# Patient Record
Sex: Female | Born: 1935 | Race: White | Hispanic: No | Smoking: Never smoker
Health system: Southern US, Community
[De-identification: ages and names within clinical notes are randomized; demographics above are authoritative.]

## PROBLEM LIST (undated history)

## (undated) DIAGNOSIS — M25519 Pain in unspecified shoulder: Secondary | ICD-10-CM

## (undated) DIAGNOSIS — I509 Heart failure, unspecified: Secondary | ICD-10-CM

## (undated) DIAGNOSIS — M810 Age-related osteoporosis without current pathological fracture: Secondary | ICD-10-CM

## (undated) DIAGNOSIS — I428 Other cardiomyopathies: Secondary | ICD-10-CM

## (undated) DIAGNOSIS — Z95810 Presence of automatic (implantable) cardiac defibrillator: Secondary | ICD-10-CM

## (undated) DIAGNOSIS — I4891 Unspecified atrial fibrillation: Secondary | ICD-10-CM

## (undated) DIAGNOSIS — R251 Tremor, unspecified: Secondary | ICD-10-CM

## (undated) DIAGNOSIS — N390 Urinary tract infection, site not specified: Secondary | ICD-10-CM

## (undated) DIAGNOSIS — I10 Essential (primary) hypertension: Secondary | ICD-10-CM

## (undated) DIAGNOSIS — I472 Ventricular tachycardia: Secondary | ICD-10-CM

## (undated) DIAGNOSIS — Z950 Presence of cardiac pacemaker: Secondary | ICD-10-CM

## (undated) DIAGNOSIS — M25552 Pain in left hip: Secondary | ICD-10-CM

## (undated) DIAGNOSIS — M1990 Unspecified osteoarthritis, unspecified site: Secondary | ICD-10-CM

## (undated) DIAGNOSIS — E7800 Pure hypercholesterolemia, unspecified: Secondary | ICD-10-CM

## (undated) DIAGNOSIS — R42 Dizziness and giddiness: Secondary | ICD-10-CM

## (undated) DIAGNOSIS — F419 Anxiety disorder, unspecified: Secondary | ICD-10-CM

## (undated) DIAGNOSIS — I519 Heart disease, unspecified: Secondary | ICD-10-CM

## (undated) DIAGNOSIS — E785 Hyperlipidemia, unspecified: Secondary | ICD-10-CM

## (undated) HISTORY — DX: Essential (primary) hypertension: I10

## (undated) HISTORY — DX: Heart failure, unspecified: I50.9

## (undated) HISTORY — PX: TONSILLECTOMY: SUR1361

## (undated) HISTORY — DX: Anxiety disorder, unspecified: F41.9

## (undated) HISTORY — DX: Presence of cardiac pacemaker: Z95.0

## (undated) HISTORY — DX: Age-related osteoporosis without current pathological fracture: M81.0

## (undated) HISTORY — DX: Heart disease, unspecified: I51.9

## (undated) HISTORY — DX: Presence of automatic (implantable) cardiac defibrillator: Z95.810

## (undated) HISTORY — DX: Pure hypercholesterolemia, unspecified: E78.00

## (undated) HISTORY — DX: Hyperlipidemia, unspecified: E78.5

## (undated) HISTORY — DX: Unspecified atrial fibrillation: I48.91

## (undated) HISTORY — DX: Pain in unspecified shoulder: M25.519

---

## 1971-02-14 HISTORY — PX: ABDOMINAL HYSTERECTOMY: SHX81

## 1990-02-13 HISTORY — PX: ANKLE FRACTURE SURGERY: SHX122

## 1990-02-13 HISTORY — PX: ANKLE SURGERY: SHX546

## 1992-02-14 HISTORY — PX: BLADDER SUSPENSION: SHX72

## 2000-12-12 ENCOUNTER — Ambulatory Visit (HOSPITAL_COMMUNITY): Admission: RE | Admit: 2000-12-12 | Discharge: 2000-12-12 | Payer: Self-pay | Admitting: Family Medicine

## 2000-12-12 ENCOUNTER — Encounter: Payer: Self-pay | Admitting: Family Medicine

## 2001-02-13 HISTORY — PX: OTHER SURGICAL HISTORY: SHX169

## 2001-07-26 ENCOUNTER — Ambulatory Visit (HOSPITAL_COMMUNITY): Admission: RE | Admit: 2001-07-26 | Discharge: 2001-07-26 | Payer: Self-pay | Admitting: Family Medicine

## 2001-07-26 ENCOUNTER — Encounter: Payer: Self-pay | Admitting: Family Medicine

## 2001-08-29 ENCOUNTER — Encounter: Payer: Self-pay | Admitting: Emergency Medicine

## 2001-08-29 ENCOUNTER — Inpatient Hospital Stay (HOSPITAL_COMMUNITY): Admission: EM | Admit: 2001-08-29 | Discharge: 2001-08-31 | Payer: Self-pay | Admitting: Emergency Medicine

## 2001-09-02 ENCOUNTER — Encounter: Payer: Self-pay | Admitting: Cardiology

## 2001-09-02 ENCOUNTER — Inpatient Hospital Stay (HOSPITAL_COMMUNITY): Admission: EM | Admit: 2001-09-02 | Discharge: 2001-09-09 | Payer: Self-pay | Admitting: Emergency Medicine

## 2001-09-07 ENCOUNTER — Encounter: Payer: Self-pay | Admitting: Cardiology

## 2001-09-23 ENCOUNTER — Encounter: Payer: Self-pay | Admitting: Cardiology

## 2001-09-23 ENCOUNTER — Ambulatory Visit (HOSPITAL_COMMUNITY): Admission: RE | Admit: 2001-09-23 | Discharge: 2001-09-23 | Payer: Self-pay | Admitting: Cardiology

## 2003-03-15 ENCOUNTER — Emergency Department (HOSPITAL_COMMUNITY): Admission: EM | Admit: 2003-03-15 | Discharge: 2003-03-15 | Payer: Self-pay | Admitting: Emergency Medicine

## 2003-06-29 ENCOUNTER — Emergency Department (HOSPITAL_COMMUNITY): Admission: EM | Admit: 2003-06-29 | Discharge: 2003-06-29 | Payer: Self-pay | Admitting: Emergency Medicine

## 2004-01-05 ENCOUNTER — Ambulatory Visit: Payer: Self-pay | Admitting: Internal Medicine

## 2004-02-11 ENCOUNTER — Ambulatory Visit: Payer: Self-pay | Admitting: Internal Medicine

## 2004-02-11 ENCOUNTER — Observation Stay (HOSPITAL_COMMUNITY): Admission: RE | Admit: 2004-02-11 | Discharge: 2004-02-11 | Payer: Self-pay | Admitting: Internal Medicine

## 2004-02-25 ENCOUNTER — Ambulatory Visit: Payer: Self-pay

## 2004-02-25 ENCOUNTER — Ambulatory Visit: Payer: Self-pay | Admitting: Internal Medicine

## 2004-06-07 ENCOUNTER — Ambulatory Visit: Payer: Self-pay | Admitting: Internal Medicine

## 2004-09-29 ENCOUNTER — Ambulatory Visit: Payer: Self-pay | Admitting: *Deleted

## 2004-09-29 ENCOUNTER — Ambulatory Visit: Payer: Self-pay | Admitting: Internal Medicine

## 2005-03-01 ENCOUNTER — Ambulatory Visit: Payer: Self-pay | Admitting: Internal Medicine

## 2005-06-22 ENCOUNTER — Ambulatory Visit: Payer: Self-pay | Admitting: *Deleted

## 2005-09-28 ENCOUNTER — Ambulatory Visit: Payer: Self-pay | Admitting: Internal Medicine

## 2005-12-28 ENCOUNTER — Ambulatory Visit: Payer: Self-pay | Admitting: Internal Medicine

## 2006-03-29 ENCOUNTER — Ambulatory Visit: Payer: Self-pay | Admitting: Internal Medicine

## 2006-06-20 ENCOUNTER — Ambulatory Visit: Payer: Self-pay | Admitting: Internal Medicine

## 2006-06-28 ENCOUNTER — Ambulatory Visit: Payer: Self-pay | Admitting: Internal Medicine

## 2006-08-03 ENCOUNTER — Ambulatory Visit: Payer: Self-pay | Admitting: Internal Medicine

## 2006-09-27 ENCOUNTER — Ambulatory Visit: Payer: Self-pay | Admitting: Internal Medicine

## 2007-03-28 ENCOUNTER — Ambulatory Visit: Payer: Self-pay | Admitting: Internal Medicine

## 2007-07-03 ENCOUNTER — Ambulatory Visit: Payer: Self-pay | Admitting: Internal Medicine

## 2007-10-03 ENCOUNTER — Ambulatory Visit: Payer: Self-pay | Admitting: Internal Medicine

## 2007-10-16 ENCOUNTER — Encounter: Payer: Self-pay | Admitting: Infectious Diseases

## 2007-10-16 ENCOUNTER — Ambulatory Visit: Payer: Self-pay | Admitting: Orthopedic Surgery

## 2007-10-16 DIAGNOSIS — M7150 Other bursitis, not elsewhere classified, unspecified site: Secondary | ICD-10-CM

## 2007-12-24 ENCOUNTER — Ambulatory Visit (HOSPITAL_COMMUNITY): Admission: RE | Admit: 2007-12-24 | Discharge: 2007-12-24 | Payer: Self-pay | Admitting: Family Medicine

## 2008-01-02 ENCOUNTER — Ambulatory Visit: Payer: Self-pay | Admitting: Internal Medicine

## 2008-01-24 ENCOUNTER — Encounter: Payer: Self-pay | Admitting: Cardiology

## 2008-01-24 ENCOUNTER — Ambulatory Visit: Payer: Self-pay | Admitting: Cardiology

## 2008-02-03 ENCOUNTER — Ambulatory Visit: Payer: Self-pay | Admitting: Cardiology

## 2008-03-25 ENCOUNTER — Encounter: Payer: Self-pay | Admitting: Internal Medicine

## 2008-04-02 ENCOUNTER — Ambulatory Visit: Payer: Self-pay | Admitting: Internal Medicine

## 2008-05-12 ENCOUNTER — Ambulatory Visit: Payer: Self-pay | Admitting: Cardiology

## 2008-07-06 ENCOUNTER — Ambulatory Visit: Payer: Self-pay | Admitting: Internal Medicine

## 2008-09-23 ENCOUNTER — Encounter (INDEPENDENT_AMBULATORY_CARE_PROVIDER_SITE_OTHER): Payer: Self-pay

## 2008-09-23 ENCOUNTER — Ambulatory Visit: Payer: Self-pay | Admitting: Internal Medicine

## 2008-09-23 DIAGNOSIS — I4729 Other ventricular tachycardia: Secondary | ICD-10-CM

## 2008-09-23 DIAGNOSIS — I442 Atrioventricular block, complete: Secondary | ICD-10-CM

## 2008-09-23 DIAGNOSIS — I472 Ventricular tachycardia: Secondary | ICD-10-CM

## 2008-09-23 DIAGNOSIS — I428 Other cardiomyopathies: Secondary | ICD-10-CM

## 2008-09-23 DIAGNOSIS — Z95810 Presence of automatic (implantable) cardiac defibrillator: Secondary | ICD-10-CM | POA: Insufficient documentation

## 2008-09-23 HISTORY — DX: Ventricular tachycardia: I47.2

## 2008-09-23 HISTORY — DX: Presence of automatic (implantable) cardiac defibrillator: Z95.810

## 2008-09-23 HISTORY — DX: Other cardiomyopathies: I42.8

## 2008-09-23 HISTORY — DX: Other ventricular tachycardia: I47.29

## 2008-09-24 ENCOUNTER — Telehealth: Payer: Self-pay | Admitting: Internal Medicine

## 2008-09-24 LAB — CONVERTED CEMR LAB
CO2: 34 meq/L — ABNORMAL HIGH (ref 19–32)
Glucose, Bld: 85 mg/dL (ref 70–99)
HCT: 40.3 % (ref 36.0–46.0)
Hemoglobin: 13.8 g/dL (ref 12.0–15.0)
INR: 1 (ref 0.0–1.5)
MCHC: 34.3 g/dL (ref 30.0–36.0)
MCV: 89.1 fL (ref 78.0–100.0)
Platelets: 212 10*3/uL (ref 150–400)
Potassium: 4.4 meq/L (ref 3.5–5.3)
Prothrombin Time: 13.5 s (ref 11.6–15.2)
Sodium: 138 meq/L (ref 135–145)

## 2008-09-25 ENCOUNTER — Ambulatory Visit (HOSPITAL_COMMUNITY): Admission: RE | Admit: 2008-09-25 | Discharge: 2008-09-25 | Payer: Self-pay | Admitting: Internal Medicine

## 2008-09-25 ENCOUNTER — Ambulatory Visit: Payer: Self-pay | Admitting: Internal Medicine

## 2008-10-14 ENCOUNTER — Ambulatory Visit: Payer: Self-pay

## 2008-12-30 ENCOUNTER — Ambulatory Visit: Payer: Self-pay | Admitting: Internal Medicine

## 2009-04-01 ENCOUNTER — Ambulatory Visit: Payer: Self-pay | Admitting: Internal Medicine

## 2009-04-02 ENCOUNTER — Encounter: Payer: Self-pay | Admitting: Internal Medicine

## 2009-04-05 ENCOUNTER — Telehealth (INDEPENDENT_AMBULATORY_CARE_PROVIDER_SITE_OTHER): Payer: Self-pay

## 2009-04-23 ENCOUNTER — Ambulatory Visit: Payer: Self-pay | Admitting: Cardiology

## 2009-07-01 ENCOUNTER — Ambulatory Visit: Payer: Self-pay | Admitting: Internal Medicine

## 2009-09-30 ENCOUNTER — Ambulatory Visit: Payer: Self-pay | Admitting: Internal Medicine

## 2009-10-22 ENCOUNTER — Encounter: Payer: Self-pay | Admitting: Internal Medicine

## 2009-11-08 ENCOUNTER — Telehealth: Payer: Self-pay | Admitting: Internal Medicine

## 2009-12-01 ENCOUNTER — Encounter: Payer: Self-pay | Admitting: Internal Medicine

## 2009-12-01 ENCOUNTER — Ambulatory Visit: Payer: Self-pay | Admitting: Internal Medicine

## 2009-12-01 ENCOUNTER — Inpatient Hospital Stay (HOSPITAL_COMMUNITY): Admission: EM | Admit: 2009-12-01 | Discharge: 2009-12-07 | Payer: Self-pay | Admitting: Emergency Medicine

## 2009-12-09 ENCOUNTER — Telehealth: Payer: Self-pay | Admitting: Cardiology

## 2009-12-13 ENCOUNTER — Encounter: Payer: Self-pay | Admitting: Internal Medicine

## 2009-12-17 ENCOUNTER — Observation Stay (HOSPITAL_COMMUNITY): Admission: EM | Admit: 2009-12-17 | Discharge: 2009-12-18 | Payer: Self-pay | Admitting: Emergency Medicine

## 2009-12-28 ENCOUNTER — Ambulatory Visit: Payer: Self-pay | Admitting: Internal Medicine

## 2009-12-28 ENCOUNTER — Encounter (INDEPENDENT_AMBULATORY_CARE_PROVIDER_SITE_OTHER): Payer: Self-pay | Admitting: *Deleted

## 2009-12-29 ENCOUNTER — Telehealth (INDEPENDENT_AMBULATORY_CARE_PROVIDER_SITE_OTHER): Payer: Self-pay

## 2010-01-03 ENCOUNTER — Encounter: Payer: Self-pay | Admitting: Internal Medicine

## 2010-01-05 ENCOUNTER — Telehealth: Payer: Self-pay | Admitting: Internal Medicine

## 2010-01-12 LAB — CONVERTED CEMR LAB
ALT: 16 units/L (ref 0–35)
AST: 18 units/L (ref 0–37)
Albumin: 4 g/dL (ref 3.5–5.2)
BUN: 15 mg/dL (ref 6–23)
Calcium: 9.4 mg/dL (ref 8.4–10.5)
Creatinine, Ser: 0.94 mg/dL (ref 0.40–1.20)
Potassium: 4.2 meq/L (ref 3.5–5.3)
Sodium: 139 meq/L (ref 135–145)
Total Protein: 6.5 g/dL (ref 6.0–8.3)

## 2010-01-20 ENCOUNTER — Encounter: Payer: Self-pay | Admitting: Internal Medicine

## 2010-03-02 ENCOUNTER — Telehealth (INDEPENDENT_AMBULATORY_CARE_PROVIDER_SITE_OTHER): Payer: Self-pay

## 2010-03-17 NOTE — Procedures (Signed)
Summary: [facility]iology Device Clinic    Allergies: No Known Drug Allergies   ICD Specifications Following MD:  Lewayne Bunting, MD     ICD Vendor:  [facility] Scientific     ICD Model Number:  H175     ICD Serial Number:  [id number] ICD DOI:  02/11/2004     ICD Implanting MD:  Lewayne Bunting, MD  Lead 1:    Location: RA     DOI: 09/06/2001     Model #: [id number]     Serial #: [id number]     Status: active Lead 2:    Location: RV     DOI: 09/06/2001     Model #: 0157     Serial #: [id number]     Status: active Lead 3:    Location: LV     DOI: 09/06/2001     Model #: 4512     Serial #: [id number]     Status: active  Indications::  NICM, CHB  Explantation Comments: 02-11-04 BSX H135 EXPLANTED  ICD Follow Up Battery Voltage:  OK V     Charge Time:  8.6 seconds     Battery Est. Longevity:  6 YRS Underlying rhythm:  SR ICD Dependent:  Yes       ICD Device Measurements Atrium:  Amplitude: 6.8 mV, Impedance: 448 ohms, Threshold: 1.4 V at 0.4 msec Right Ventricle:  Amplitude: 25.0 mV, Impedance: 442 ohms, Threshold: 1.3 V at 0.4 msec Left Ventricle:  Amplitude: 17.3 mV, Impedance: 527 ohms, Threshold: 2.9 V at 1.2 msec Configuration: LV TIP-RV Shock Impedance: 47 ohms   Episodes MS Episodes:  2     Percent Mode Switch:  <1%     Coumadin:  No Shock:  5     ATP:  13     Nonsustained:  237     Atrial Pacing:  <1%     Ventricular Pacing:  81%  Brady Parameters Mode DDD     Lower Rate Limit:  50     Upper Rate Limit 120 PAV 120      Tachy Zones VF:  210     VT:  160     Tech Comments:  PT CHECKED IN ER. 237 NST EPISODES.  13 ATP THERAPIES AND 5 TOTAL SHOCKS. CHANGED 23J TO 31 J DUE TO 23J NOT WORKING FIRST TIME IN VF ZONE.  GT AWARE OF CHANGES.  Vella Kohler  December 01, 2009 2:28 PM

## 2010-03-17 NOTE — Assessment & Plan Note (Signed)
Summary: post hosp per AMber/tg  Medications Added BENAZEPRIL HCL 10 MG TABS (BENAZEPRIL HCL) Take 1 tablet by mouth once a day FUROSEMIDE 40 MG TABS (FUROSEMIDE) take 1 tab daily COREG 3.125 MG TABS (CARVEDILOL) take 1 tab two times a day      Allergies Added: NKDA  Visit Type:  Follow-up Primary Chad Tiznado:  luking,steve  CC:  no cardiology complaints.  History of Present Illness: [name] returns today for followup. She is a pleasant 77 yo woman with a h/o DCM, VT/VF, s/p ICD, and CHF.  She was recently hospitalized with recurrent VF and found to have CHF with very elevated pulmonary venous pressures and underwent diuresis. She was subsequently rehospitalized secondary to volume depletion and worsening renal insufficiency, her diuretics were held, and she was discharged home.  She denies c/p, sob, or peripheral edema. She has had no intercurrent ICD therapies and has not had trouble with amiodarone.  Current Medications (verified): 1)  Benazepril Hcl 10 Mg Tabs (Benazepril Hcl) .... Take 1 Tablet By Mouth Once A Day 2)  Furosemide 40 Mg Tabs (Furosemide) .... Take 1 Tab Daily 3)  Centrum Silver  Tabs (Multiple Vitamins-Minerals) .... Take 1 Tab Daily 4)  Xalatan 0.005 % Soln (Latanoprost) .Marland Kitchen.. 1 Drop Each Eye Qhs 5)  Oscal 500/200 D-3 500-200 Mg-Unit Tabs (Calcium-Vitamin D) .... Take 1 Tab Daily 6)  Coreg 3.125 Mg Tabs (Carvedilol) .... Take 1 Tab Two Times A Day  Allergies (verified): No Known Drug Allergies  Past History:  Past Medical History: Last updated: 10/16/2007 GLAUCOMA CHF htn  Past Surgical History: Last updated: 09/21/2008 icd implantation  Review of Systems  The patient denies chest pain, syncope, dyspnea on exertion, and peripheral edema.    Vital Signs:  Patient profile:   77 year old female Weight:      144 pounds BMI:     24.05 Pulse rate:   50 / minute BP sitting:   116 / 67  (right arm)  Vitals Entered By: [name] Saa, CNA (December 28, 2009 4:07 PM)  Physical Exam  General:  Well developed, well nourished, in no acute distress. Head:  normocephalic and atraumatic Eyes:  PERRLA/EOM intact; conjunctiva and lids normal. Mouth:  Teeth, gums and palate normal. Oral mucosa normal. Neck:  Neck supple, no JVD. No masses, thyromegaly or abnormal cervical nodes. Chest Wall:  no deformities or breast masses noted Lungs:  Clear bilaterally to auscultation with no wheezes, rales, or rhonchi. Heart:  PMI displaced, normal S1-S2, no gallop, regular rate and rhythm Abdomen:  Bowel sounds positive; abdomen soft and non-tender without masses, organomegaly, or hernias noted. No hepatosplenomegaly. Msk:  decreased ROM.   Pulses:  pulses normal in all 4 extremities Extremities:  No clubbing or cyanosis. Neurologic:  Alert and oriented x 3.    ICD Specifications Following MD:  [name] Bunting, MD     ICD Vendor:  [facility] [employer]     ICD Model Number:  [id number]     ICD Serial Number:  [id number] ICD DOI:  02/11/2004     ICD Implanting MD:  [name] Bunting, MD  Lead 1:    Location: RA     DOI: 09/06/2001     Model #: 4086     Serial #: [id number]     Status: active Lead 2:    Location: RV     DOI: 09/06/2001     Model #: 0157     Serial #: [id number]     Status: active  Lead 3:    Location: LV     DOI: 09/06/2001     Model #: 4512     Serial #: [id number]     Status: active  Indications::  NICM, CHB  Explantation Comments: 02-11-04 BSX H135 EXPLANTED  ICD Follow Up Remote Check?  No Battery Voltage:  good V     ICD Dependent:  Yes       ICD Device Measurements Configuration: LV TIP-RV  Episodes Coumadin:  No  Brady Parameters Mode DDD     Lower Rate Limit:  50     Upper Rate Limit 120 PAV 120      Tachy Zones VF:  210     VT:  160     Tech Comments:  Interrogation only, no episodes since hospital 12/04/09.  Latitude transmissions every 3 months.  ROV  6 months with Dr. Ladona Ridgel in [place]. Altha Harm, LPN  December 28, 2009 4:24 PM n MD Comments:   Agree with above.  Impression & Recommendations:  Problem # 1:  PAROXYSMAL VENTRICULAR TACHYCARDIA (ICD-427.1) Her symptoms appear to be well controlled. She will continue on her amiodarone which has been prescribed since discharge from her initial hospitalization for VT.  My plan will be to reduce her dose in several months. The following medications were removed from the medication list:    Carvedilol 6.25 Mg Tabs (Carvedilol) .Marland Kitchen... Take one tablet by mouth twice a day Her updated medication list for this problem includes:    Benazepril Hcl 10 Mg Tabs (Benazepril hcl) .Marland Kitchen... Take 1 tablet by mouth once a day    Coreg 3.125 Mg Tabs (Carvedilol) .Marland Kitchen... Take 1 tab two times a day  Problem # 2:  CHRONIC SYSTOLIC HEART FAILURE (ICD-428.22) Her symptoms are well compensated and she will continue her current meds.  I will check in labs. The following medications were removed from the medication list:    Carvedilol 6.25 Mg Tabs (Carvedilol) .Marland Kitchen... Take one tablet by mouth twice a day Her updated medication list for this problem includes:    Benazepril Hcl 10 Mg Tabs (Benazepril hcl) .Marland Kitchen... Take 1 tablet by mouth once a day    Furosemide 40 Mg Tabs (Furosemide) .Marland Kitchen... Take 1 tab daily    Coreg 3.125 Mg Tabs (Carvedilol) .Marland Kitchen... Take 1 tab two times a day  Future Orders: T-Comprehensive Metabolic Panel ([id number]) ... 01/04/2010  Problem # 3:  AUTOMATIC IMPLANTABLE CARDIAC DEFIBRILLATOR SITU (ICD-V45.02) Her device is working normally today.  Will follow.  Patient Instructions: 1)  Your physician recommends that you schedule a follow-up appointment in: 3 months 2)  Your physician recommends that you return for lab work JY:NWGN week 3)  Your physician has recommended you make the following change in your medication: benazepril 10mg  daily Prescriptions: BENAZEPRIL HCL 10 MG TABS (BENAZEPRIL HCL) Take 1 tablet by mouth once a day  #90 x 3   Entered by:   Teressa Lower RN   Authorized by:   Laren Boom, MD, [facility]   Signed by:   Teressa Lower RN on 12/28/2009   Method used:   Electronically to        [facility]  [address] (retail)       [address]       [place], [place]  [address]       Ph: [phone]       Fax: [phone]   RxID:   [id number]

## 2010-03-17 NOTE — Progress Notes (Signed)
Summary: Drug Interaction  Medications Added CARVEDILOL 6.25 MG TABS (CARVEDILOL) Take one tablet by mouth twice a day       Phone Note Other Incoming Call back at [phone]   Caller: Judeth Cornfield  w/ [facility]  Summary of Call: states that patient is having Level 1 drug interaction to Simvastatin and Amiodarone / medicines were prescribed post hosp by Dr.Taylor / he will not be back in [place] until 11/15 / pls advise on what to do / tg Initial call taken by: Raechel Ache [facility],  December 09, 2009 10:47 AM  Follow-up for Phone Call        lmom for [facility] Pt's primary cardiologist is Dr Daleen Squibb.  Let her know that I would be glad to help her in any way and to call the office back with problems pt is having Dennis Bast, RN, BSN  December 09, 2009 4:19 PM spoke with Judeth Cornfield pt is hypotensive 90/60 and feels sluggish.  We will decrease her Benazapril to 10mg  daily and decrease her Simvastatin to 20mg  until Monday when they go back and asses pt  Additional Follow-up for Phone Call Additional follow up Details #1::        BP on fri 112/60 and pt felt good.  Today her BP 88/60.  Hold Benazapril  tomorrow and Jhonnie Garner.  Re-assess on Wen. Discussed with Dr Ladona Ridgel will decrease CArvedilol to 6.25mg  two times a day and stop Simvastatin when [facility] calls back on Evonnie Pat, RN, BSN  December 13, 2009 2:15 PM BP is great today gave [facility] the above inforation.  Will call in Carvedilol 6.25mg  two times a day to [facility] in [place]  Pt is staying with her son at present and his # is [phone] Dennis Bast, RN, BSN  December 15, 2009 1:26 PM    New/Updated Medications: CARVEDILOL 6.25 MG TABS (CARVEDILOL) Take one tablet by mouth twice a day Prescriptions: CARVEDILOL 6.25 MG TABS (CARVEDILOL) Take one tablet by mouth twice a day  #60 x 11   Entered by:   Dennis Bast, RN, BSN   Authorized by:   Gaylord Shih, MD, [facility]   Signed by:   Dennis Bast, RN, BSN on 12/15/2009   Method used:    Electronically to        [facility]  [address] (retail)       [address]       [place], [place]  [address]       Ph: [phone]       Fax: [phone]   RxID:   [id number]

## 2010-03-17 NOTE — Letter (Signed)
[facility]   Imported By: Marylou Mccoy 01/12/2010 15:13:55  _____________________________________________________________________  External Attachment:    Type:   Image     Comment:   External Document

## 2010-03-17 NOTE — Progress Notes (Signed)
   Phone Note Call from Patient Call back at Home Phone [phone]   Caller: pt Reason for Call: Talk to Nurse Summary of Call: pt felt like she was going to pass out last night. she feels fine now. Initial call taken by: Faythe Ghee,  November 08, 2009 8:49 AM  Follow-up for Phone Call        spoke with pt around 10:00pm she was playing cards felt like going to pass out.  lasted longer than usual.  Defib didn't go off.  Nurse took pulse and it was irregular.  Will have her send in transmission.  Dennis Bast, RN, BSN  November 08, 2009 9:30 AMlmom that we got transmission  everything looks great.  She can call me back when she gets this message.   Dennis Bast, RN, BSN  November 08, 2009 11:34 AM

## 2010-03-17 NOTE — Cardiovascular Report (Signed)
Summary: Office Visit Remote   Office Visit Remote   Imported By: Roderic Ovens 10/26/2009 10:00:17  _____________________________________________________________________  External Attachment:    Type:   Image     Comment:   External Document

## 2010-03-17 NOTE — Assessment & Plan Note (Signed)
Summary: PAST DUE FOR F/U PER PT PHONE CALL/TG  Medications Added BENAZEPRIL HCL 20 MG TABS (BENAZEPRIL HCL) take 1 tab daily LUMIGAN 0.03 % SOLN (BIMATOPROST) 1 drop each eye at bedtime CENTRUM SILVER  TABS (MULTIPLE VITAMINS-MINERALS) take 1 tab daily OSCAL 500/200 D-3 500-200 MG-UNIT TABS (CALCIUM-VITAMIN D) take 1 tab daily      Allergies Added: NKDA  Visit Type:  Follow-up Primary Provider:  luking,steve  CC:  no complaints today.  History of Present Illness: Ms [name] is in today for followup of her chronic systolic heart failure.  2 she had a biventricular pacer placed she's been doing better physically. She has lost 1 pound. She denies any orthopnea, PND or peripheral edema.  She's had no shocks.  Current Medications (verified): 1)  Benazepril Hcl 20 Mg Tabs (Benazepril Hcl) .... Take 1 Tab Daily 2)  Furosemide 40 Mg Tabs (Furosemide) .... Take 1 and 1/2 Tab Daily 3)  Carvedilol 25 Mg Tabs (Carvedilol) .... 1/2 Tablet By Mouth Twice Daily 4)  Lumigan 0.03 % Soln (Bimatoprost) .Marland Kitchen.. 1 Drop Each Eye At Bedtime 5)  Centrum Silver  Tabs (Multiple Vitamins-Minerals) .... Take 1 Tab Daily 6)  Simvastatin 40 Mg Tabs (Simvastatin) .... Take 1 Tablet Daily 7)  Klor-Con M10 10 Meq Cr-Tabs (Potassium Chloride Crys Cr) .... Take 1 Tab Daily 8)  Xalatan 0.005 % Soln (Latanoprost) .Marland Kitchen.. 1 Drop Each Eye Qhs 9)  Oscal 500/200 D-3 500-200 Mg-Unit Tabs (Calcium-Vitamin D) .... Take 1 Tab Daily  Allergies (verified): No Known Drug Allergies  Past History:  Past Medical History: Last updated: 10/16/2007 GLAUCOMA CHF htn  Past Surgical History: Last updated: 09/21/2008 icd implantation  Family History: Last updated: 09/21/2008 Family History Coronary Heart Disease female < 65 Father:deceased cause unknown Mother:deceased cause unknown  Social History: Last updated: 09/21/2008 Patient is widowed.  Retired  Tobacco Use - No.  Alcohol Use - no Regular Exercise - no Drug  Use - no  Risk Factors: Caffeine Use: 2 (10/16/2007) Exercise: no (09/21/2008)  Risk Factors: Smoking Status: never (09/21/2008)  Review of Systems        negative other than history of present illness  Vital Signs:  Patient profile:   77 year old female Weight:      154 pounds Pulse rate:   76 / minute BP sitting:   103 / 63  (right arm)  Vitals Entered By: Dreama Saa, CNA (April 23, 2009 1:43 PM)  Physical Exam  General:  Well developed, well nourished, in no acute distress. Head:  normocephalic and atraumatic Eyes:  PERRLA/EOM intact; conjunctiva and lids normal. Neck:  Neck supple, no JVD. No masses, thyromegaly or abnormal cervical nodes. Chest Ashari Llewellyn:  no deformities or breast masses noted Lungs:  Clear bilaterally to auscultation and percussion. Heart:  PMI displaced, normal S1-S2, no gallop, regular rate and rhythm Msk:  decreased ROM.   Pulses:  pulses normal in all 4 extremities Extremities:  No clubbing or cyanosis. Neurologic:  Alert and oriented x 3. Skin:  Intact without lesions or rashes. Psych:  Normal affect.    ICD Specifications Following MD:  Lewayne Bunting, MD     ICD Vendor:  [facility] [employer]     ICD Model Number:  [phone]     ICD Serial Number:  [id number] ICD DOI:  02/11/2004     ICD Implanting MD:  Lewayne Bunting, MD  Lead 1:    Location: RA     DOI: 09/06/2001     Model #:  [id number]     Serial #: [id number]     Status: active Lead 2:    Location: RV     DOI: 09/06/2001     Model #: 0157     Serial #: [id number]     Status: active Lead 3:    Location: LV     DOI: 09/06/2001     Model #: 4512     Serial #: [id number]     Status: active  Indications::  NICM, CHB  Explantation Comments: 02-11-04 BSX H135 EXPLANTED  ICD Follow Up ICD Dependent:  Yes       ICD Device Measurements Configuration: LV TIP-RV  Episodes Coumadin:  No  Brady Parameters Mode DDD     Lower Rate Limit:  50     Upper Rate Limit 120 PAV 120      Tachy Zones VF:  210     VT:  160       Impression & Recommendations:  Problem # 1:  CHRONIC SYSTOLIC HEART FAILURE (ICD-428.22) Assessment Improved  Her updated medication list for this problem includes:    Benazepril Hcl 20 Mg Tabs (Benazepril hcl) .Marland Kitchen... Take 1 tab daily    Furosemide 40 Mg Tabs (Furosemide) .Marland Kitchen... Take 1 and 1/2 tab daily    Carvedilol 25 Mg Tabs (Carvedilol) .Marland Kitchen... 1/2 tablet by mouth twice daily  Problem # 2:  VENTRICULAR TACHYCARDIA (ICD-427.1) Assessment: Improved  Her updated medication list for this problem includes:    Benazepril Hcl 20 Mg Tabs (Benazepril hcl) .Marland Kitchen... Take 1 tab daily    Carvedilol 25 Mg Tabs (Carvedilol) .Marland Kitchen... 1/2 tablet by mouth twice daily  Problem # 3:  AUTOMATIC IMPLANTABLE CARDIAC DEFIBRILLATOR SITU (ICD-V45.02) Assessment: Unchanged  Problem # 4:  ATRIOVENTRICULAR BLOCK, COMPLETE (ICD-426.0) Assessment: Unchanged  Her updated medication list for this problem includes:    Benazepril Hcl 20 Mg Tabs (Benazepril hcl) .Marland Kitchen... Take 1 tab daily    Carvedilol 25 Mg Tabs (Carvedilol) .Marland Kitchen... 1/2 tablet by mouth twice daily  Patient Instructions: 1)  Your physician recommends that you schedule a follow-up appointment in: 6 months 2)  Your physician recommends that you continue on your current medications as directed. Please refer to the Current Medication list given to you today.

## 2010-03-17 NOTE — Progress Notes (Signed)
**Note De-Identified Lailoni Baquera Obfuscation** Summary: overdue for appt.   Phone Note Outgoing Call   Call placed by: Larita Fife Avon Mergenthaler LPN,  April 05, 2009 2:13 PM Summary of Call: Patient advised that she needs to have f/u with Dr. Daleen Squibb  (she is 3 months overdue). I explained that we will refill her Klor-Con for a 30 day supply each month until she is seen then we will give more refills. She ask that I refill Klor-Con for 90 day supply due to cost, I agreed and sent call to front desk to schedule appt.

## 2010-03-17 NOTE — Progress Notes (Signed)
**Note De-Identified Jadae Steinke Obfuscation** Summary: can pt take aleve  Medications Added AMIODARONE HCL 200 MG TABS (AMIODARONE HCL) take 2 tablets by mouth in am       Phone Note Call from Patient Call back at Home Phone [phone]   Caller: pt Reason for Call: Talk to Nurse Summary of Call: pt has been having pain in hip and wants to make sure it is okay to take aleve with her other medications Initial call taken by: Faythe Ghee,  March 02, 2010 2:57 PM  Follow-up for Phone Call        Pt. advised that she may take Aleve for hip pain. Also, pt. states that she was put on Amiodarone 200mg  (take 2 tablets by mouth in am) on hosp. discharge of 12-18-09 but med was not added at [facility] on 12-28-09. Amiodarone added to med list today. Follow-up by: Larita Fife Cantrell Martus LPN,  March 02, 2010 3:18 PM    New/Updated Medications: AMIODARONE HCL 200 MG TABS (AMIODARONE HCL) take 2 tablets by mouth in am

## 2010-03-17 NOTE — Cardiovascular Report (Signed)
Summary: Office Visit Remote   Office Visit Remote   Imported By: Roderic Ovens 07/29/2009 16:08:09  _____________________________________________________________________  External Attachment:    Type:   Image     Comment:   External Document

## 2010-03-17 NOTE — Miscellaneous (Signed)
Summary: Home Health Certification/Care Plan   Home Health Certification/Care Plan   Imported By: Roderic Ovens 01/25/2010 15:26:48  _____________________________________________________________________  External Attachment:    Type:   Image     Comment:   External Document

## 2010-03-17 NOTE — Letter (Signed)
Summary: Remote Device Check  [facility], Main Office  [address]   [place], [place] [address]   Phone: [phone]  Fax: [phone]     October 22, 2009 MRN: [id number]   [facility] [address] [place], [place]  [address]   Dear [name],   Your remote transmission was recieved and reviewed by your physician.  All diagnostics were within normal limits for you.   __X____Your next office visit is scheduled for:   November 2011 with Dr Ladona Ridgel. Please call our office to schedule an appointment.    Sincerely,  Vella Kohler

## 2010-03-17 NOTE — Cardiovascular Report (Signed)
Summary: Office Visit   Office Visit   Imported By: Roderic Ovens 01/18/2010 11:09:11  _____________________________________________________________________  External Attachment:    Type:   Image     Comment:   External Document

## 2010-03-17 NOTE — Progress Notes (Signed)
Summary: lab results/question re med   Phone Note Call from Patient   Caller: Patient [phone] Reason for Call: Talk to Nurse Summary of Call: calling for lab results/question re med Initial call taken by: Glynda Jaeger,  January 05, 2010 10:22 AM  Follow-up for Phone Call        results called to pt, verbalized understanding Follow-up by: Teressa Lower RN,  January 05, 2010 11:18 AM

## 2010-03-17 NOTE — Letter (Signed)
Summary: [facility], [facility] at [employer]  [address] [place] [address]   Phone: [phone]  Fax: [phone]     December 28, 2009 MRN: [id number]   [facility] [address] [place], [place]  [address]      YOUR LAB WORK IS DUE   January 04, 2010  Please go to [facility], located across the street from [facility] on the second floor.  Hours are Monday - Friday 7am until 7:30pm         Saturday 8am until 12noon      __ YOUR LABWORK IS NOT FASTING --YOU MAY EAT PRIOR TO LABWORK

## 2010-03-17 NOTE — Cardiovascular Report (Signed)
Summary: Office Visit Remote   Office Visit Remote   Imported By: Roderic Ovens 04/12/2009 11:16:12  _____________________________________________________________________  External Attachment:    Type:   Image     Comment:   External Document

## 2010-03-17 NOTE — Progress Notes (Signed)
Summary: Simvastatin   Phone Note Call from Patient   Caller: Patient Details for Reason: Simvastatin Summary of Call: Pt. was advised to stop taking Simvastatin on hosp. discharge of 11-4. She was seen in office by Dr. Ladona Ridgel yesterday and given a list of her updated medications list. Pt. did not notice until this morning that Simvastatin remains on this list and wanted [place] to correct. Simvastatin has been removed form pt's medications list. Initial call taken by: Larita Fife Via LPN,  December 29, 2009 1:49 PM

## 2010-04-06 ENCOUNTER — Encounter: Payer: Self-pay | Admitting: Internal Medicine

## 2010-04-06 ENCOUNTER — Encounter (INDEPENDENT_AMBULATORY_CARE_PROVIDER_SITE_OTHER): Payer: Self-pay | Admitting: Internal Medicine

## 2010-04-06 ENCOUNTER — Telehealth (INDEPENDENT_AMBULATORY_CARE_PROVIDER_SITE_OTHER): Payer: Self-pay

## 2010-04-06 DIAGNOSIS — I5022 Chronic systolic (congestive) heart failure: Secondary | ICD-10-CM

## 2010-04-06 DIAGNOSIS — I472 Ventricular tachycardia: Secondary | ICD-10-CM

## 2010-04-07 ENCOUNTER — Encounter: Payer: Self-pay | Admitting: Internal Medicine

## 2010-04-12 NOTE — Progress Notes (Signed)
Summary: Medication Questions   Phone Note Call from Patient   Caller: Patient Reason for Call: Talk to Nurse Summary of Call: pt has questions regarding medication changes from office visit this AM /tg Initial call taken by: Raechel Ache [facility],  April 06, 2010 3:51 PM  Follow-up for Phone Call        Pt. advised that Amiodarone was decreased to 1 tablet daily. Follow-up by: Larita Fife Via LPN,  April 06, 2010 4:53 PM

## 2010-04-26 LAB — CBC
HCT: 42.4 % (ref 36.0–46.0)
MCH: 28.7 pg (ref 26.0–34.0)
MCH: 29.9 pg (ref 26.0–34.0)
MCHC: 32.5 g/dL (ref 30.0–36.0)
MCV: 88.5 fL (ref 78.0–100.0)
Platelets: 283 10*3/uL (ref 150–400)
RDW: 13.3 % (ref 11.5–15.5)
RDW: 13.4 % (ref 11.5–15.5)

## 2010-04-26 LAB — URINALYSIS, ROUTINE W REFLEX MICROSCOPIC
Bilirubin Urine: NEGATIVE
Hgb urine dipstick: NEGATIVE
Nitrite: NEGATIVE
Protein, ur: NEGATIVE mg/dL

## 2010-04-26 LAB — BASIC METABOLIC PANEL
BUN: 33 mg/dL — ABNORMAL HIGH (ref 6–23)
CO2: 31 mEq/L (ref 19–32)
GFR calc Af Amer: 49 mL/min — ABNORMAL LOW (ref >60)
GFR calc Af Amer: >60 mL/min (ref >60)
Glucose, Bld: 89 mg/dL (ref 70–99)
Sodium: 134 mEq/L — ABNORMAL LOW (ref 135–145)
Sodium: 135 mEq/L (ref 135–145)

## 2010-04-26 LAB — POCT I-STAT, CHEM 8
Calcium, Ion: 1.05 mmol/L — ABNORMAL LOW (ref 1.12–1.32)
Calcium, Ion: 1.1 mmol/L — ABNORMAL LOW (ref 1.12–1.32)
Glucose, Bld: 131 mg/dL — ABNORMAL HIGH (ref 70–99)
Glucose, Bld: 182 mg/dL — ABNORMAL HIGH (ref 70–99)
HCT: 45 % (ref 36.0–46.0)
Hemoglobin: 14.3 g/dL (ref 12.0–15.0)
Hemoglobin: 15.3 g/dL — ABNORMAL HIGH (ref 12.0–15.0)
Potassium: 4.7 mEq/L (ref 3.5–5.1)
Sodium: 130 mEq/L — ABNORMAL LOW (ref 135–145)
TCO2: 28 mmol/L (ref 0–100)
TCO2: 29 mmol/L (ref 0–100)

## 2010-04-26 LAB — URINE MICROSCOPIC-ADD ON

## 2010-04-26 LAB — DIFFERENTIAL
Basophils Relative: 0 % (ref 0–1)
Monocytes Relative: 7 % (ref 3–12)
Neutro Abs: 7.1 10*3/uL (ref 1.7–7.7)

## 2010-04-26 NOTE — Assessment & Plan Note (Signed)
Summary: f20m/ per pt checkout 12/28/09/tymj/hm  Medications Added COREG 3.125 MG TABS (CARVEDILOL) take 1 tab two times a day AMIODARONE HCL 200 MG TABS (AMIODARONE HCL) take 1 tablet by mouth in am      Allergies Added: NKDA  Visit Type:  Follow-up Primary Provider:  luking,steve  CC:  real fatigued.  History of Present Illness: [name] returns today for followup. She is a pleasant 77 yo woman with a h/o DCM, VT/VF, s/p ICD, and CHF.  She was recently hospitalized with recurrent VF and found to have CHF with very elevated pulmonary venous pressures and underwent diuresis. She was subsequently rehospitalized secondary to volume depletion and worsening renal insufficiency, her diuretics were held, and she was discharged home.  She denies c/p, sob, or peripheral edema. She has had no intercurrent ICD therapies and has not had trouble with amiodarone. She does have class 2 CHF symptoms.  Current Medications (verified): 1)  Benazepril Hcl 10 Mg Tabs (Benazepril Hcl) .... Take 1 Tablet By Mouth Once A Day 2)  Furosemide 40 Mg Tabs (Furosemide) .... Take 1 Tab Daily 3)  Centrum Silver  Tabs (Multiple Vitamins-Minerals) .... Take 1 Tab Daily 4)  Xalatan 0.005 % Soln (Latanoprost) .Marland Kitchen.. 1 Drop Each Eye Qhs 5)  Oscal 500/200 D-3 500-200 Mg-Unit Tabs (Calcium-Vitamin D) .... Take 1 Tab Daily 6)  Coreg 3.125 Mg Tabs (Carvedilol) .... Take 1 Tab Two Times A Day 7)  Amiodarone Hcl 200 Mg Tabs (Amiodarone Hcl) .... Take 1 Tablet By Mouth in Am  Allergies (verified): No Known Drug Allergies  Comments:  Nurse/Medical Assistant: patient brought meds we reviewed she uses [facility] in [place]  Past History:  Past Medical History: Last updated: 10/16/2007 GLAUCOMA CHF htn  Past Surgical History: Last updated: 09/21/2008 icd implantation  Review of Systems       The patient complains of dyspnea on exertion.  The patient denies chest pain, syncope, and peripheral edema.    Vital  Signs:  Patient profile:   77 year old female Weight:      140 pounds BMI:     23.38 Pulse rate:   50 / minute BP sitting:   152 / 75  (left arm)  Vitals Entered By: Dreama Saa, CNA (April 06, 2010 11:04 AM)  Physical Exam  General:  Well developed, well nourished, in no acute distress. Head:  normocephalic and atraumatic Eyes:  PERRLA/EOM intact; conjunctiva and lids normal. Mouth:  Teeth, gums and palate normal. Oral mucosa normal. Neck:  Neck supple, no JVD. No masses, thyromegaly or abnormal cervical nodes. Chest Wall:  Well healed ICD incision. Lungs:  Clear bilaterally to auscultation with no wheezes, rales, or rhonchi. Heart:  PMI displaced, normal S1-S2, no gallop, regular rate and rhythm Abdomen:  Bowel sounds positive; abdomen soft and non-tender without masses, organomegaly, or hernias noted. No hepatosplenomegaly. Msk:  decreased ROM.   Pulses:  pulses normal in all 4 extremities Extremities:  No clubbing or cyanosis. Neurologic:  Alert and oriented x 3.    ICD Specifications Following MD:  Lewayne Bunting, MD     ICD Vendor:  [employer]     ICD Model Number:  [id number]     ICD Serial Number:  [id number] ICD DOI:  02/11/2004     ICD Implanting MD:  Lewayne Bunting, MD  Lead 1:    Location: RA     DOI: 09/06/2001     Model #: [id number]     Serial #: [id number]  Status: active Lead 2:    Location: RV     DOI: 09/06/2001     Model #: [id number]     Serial #: [id number]     Status: active Lead 3:    Location: LV     DOI: 09/06/2001     Model #: [id number]     Serial #: [id number]     Status: active  Indications::  NICM, CHB  Explantation Comments: 02-11-04 BSX H135 EXPLANTED  ICD Follow Up ICD Dependent:  Yes       ICD Device Measurements Configuration: LV TIP-RV  Episodes Coumadin:  No  Brady Parameters Mode DDD     Lower Rate Limit:  50     Upper Rate Limit 120 PAV 120      Tachy Zones VF:  210     VT:  160     MD Comments:  Her device is working normally.  Impression &  Recommendations:  Problem # 1:  PAROXYSMAL VENTRICULAR TACHYCARDIA (ICD-427.1) Her VT is well controlled. Will follow and reduce amiodarone to 200 mg daily. Her updated medication list for this problem includes:    Benazepril Hcl 10 Mg Tabs (Benazepril hcl) .Marland Kitchen... Take 1 tablet by mouth once a day    Coreg 3.125 Mg Tabs (Carvedilol) .Marland Kitchen... Take 1 tab two times a day    Amiodarone Hcl 200 Mg Tabs (Amiodarone hcl) .Marland Kitchen... Take 1 tablet by mouth in am  Problem # 2:  CHRONIC SYSTOLIC HEART FAILURE (ICD-428.22) Her symptoms are well compensated class 2. She will continue her current meds and weigh her self daily. Her updated medication list for this problem includes:    Benazepril Hcl 10 Mg Tabs (Benazepril hcl) .Marland Kitchen... Take 1 tablet by mouth once a day    Furosemide 40 Mg Tabs (Furosemide) .Marland Kitchen... Take 1 tab daily    Coreg 3.125 Mg Tabs (Carvedilol) .Marland Kitchen... Take 1 tab two times a day    Amiodarone Hcl 200 Mg Tabs (Amiodarone hcl) .Marland Kitchen... Take 1 tablet by mouth in am  Patient Instructions: 1)  Your physician recommends that you schedule a follow-up appointment in: 6 months 2)  Your physician has recommended you make the following change in your medication: decrease Amiodarone 200mg  to 1 tablet by mouth once daily  Prescriptions: COREG 3.125 MG TABS (CARVEDILOL) take 1 tab two times a day  #180 x 1   Entered by:   Larita Fife Via LPN   Authorized by:   [name] Boom, MD, [facility]   Signed by:   Larita Fife Via LPN on 14/78/2956   Method used:   Electronically to        [facility]  [address] (retail)       [address]       [place], [place]  [address]       Ph: [phone]       Fax: [phone]   RxID:   [id number] AMIODARONE HCL 200 MG TABS (AMIODARONE HCL) take 1 tablet by mouth in am  #90 x 1   Entered by:   Larita Fife Via LPN   Authorized by:   [name] Boom, MD, [facility]   Signed by:   Larita Fife Via LPN on 25/95/6387   Method used:   Electronically to        [facility]  [address]  (retail)       [address]       [place]  [place], [place]  [address]       Ph: [phone]       Fax: [phone]   RxID:   [id number]

## 2010-04-27 LAB — URINALYSIS, ROUTINE W REFLEX MICROSCOPIC
Bilirubin Urine: NEGATIVE
Ketones, ur: NEGATIVE mg/dL
Urobilinogen, UA: 1 mg/dL (ref 0.0–1.0)

## 2010-04-27 LAB — POCT I-STAT 3, ART BLOOD GAS (G3+)
Bicarbonate: 29.6 mEq/L — ABNORMAL HIGH (ref 20.0–24.0)
TCO2: 31 mmol/L (ref 0–100)
pCO2 arterial: 58.1 mmHg (ref 35.0–45.0)
pH, Arterial: 7.315 — ABNORMAL LOW (ref 7.350–7.400)

## 2010-04-27 LAB — COMPREHENSIVE METABOLIC PANEL
ALT: 18 U/L (ref 0–35)
AST: 24 U/L (ref 0–37)
Alkaline Phosphatase: 58 U/L (ref 39–117)
CO2: 29 mEq/L (ref 19–32)
Calcium: 8.6 mg/dL (ref 8.4–10.5)
Chloride: 105 mEq/L (ref 96–112)
GFR calc Af Amer: >60 mL/min (ref >60)
GFR calc non Af Amer: >60 mL/min (ref >60)
Potassium: 3.9 mEq/L (ref 3.5–5.1)
Sodium: 140 mEq/L (ref 135–145)
Total Bilirubin: 0.7 mg/dL (ref 0.3–1.2)

## 2010-04-27 LAB — CK TOTAL AND CKMB (NOT AT ARMC)
CK, MB: 2.2 ng/mL (ref 0.3–4.0)
Total CK: 60 U/L (ref 7–177)

## 2010-04-27 LAB — BASIC METABOLIC PANEL
BUN: 12 mg/dL (ref 6–23)
BUN: 14 mg/dL (ref 6–23)
BUN: 6 mg/dL (ref 6–23)
BUN: 9 mg/dL (ref 6–23)
CO2: 33 mEq/L — ABNORMAL HIGH (ref 19–32)
Calcium: 8.8 mg/dL (ref 8.4–10.5)
Calcium: 9.2 mg/dL (ref 8.4–10.5)
Calcium: 9.5 mg/dL (ref 8.4–10.5)
Chloride: 102 mEq/L (ref 96–112)
Chloride: 106 mEq/L (ref 96–112)
Chloride: 97 mEq/L (ref 96–112)
Creatinine, Ser: 0.71 mg/dL (ref 0.4–1.2)
Creatinine, Ser: 0.81 mg/dL (ref 0.4–1.2)
Creatinine, Ser: 0.83 mg/dL (ref 0.4–1.2)
GFR calc Af Amer: >60 mL/min (ref >60)
GFR calc Af Amer: >60 mL/min (ref >60)
GFR calc non Af Amer: >60 mL/min (ref >60)
GFR calc non Af Amer: >60 mL/min (ref >60)
Glucose, Bld: 101 mg/dL — ABNORMAL HIGH (ref 70–99)
Glucose, Bld: 91 mg/dL (ref 70–99)
Potassium: 3.9 mEq/L (ref 3.5–5.1)

## 2010-04-27 LAB — URINE MICROSCOPIC-ADD ON

## 2010-04-27 LAB — DIFFERENTIAL
Basophils Absolute: 0 10*3/uL (ref 0.0–0.1)
Basophils Absolute: 0.1 10*3/uL (ref 0.0–0.1)
Basophils Relative: 0 % (ref 0–1)
Eosinophils Absolute: 0.5 10*3/uL (ref 0.0–0.7)
Eosinophils Relative: 6 % — ABNORMAL HIGH (ref 0–5)
Monocytes Absolute: 0.8 10*3/uL (ref 0.1–1.0)
Monocytes Relative: 8 % (ref 3–12)
Neutrophils Relative %: 50 % (ref 43–77)

## 2010-04-27 LAB — CBC
HCT: 37.5 % (ref 36.0–46.0)
HCT: 41.7 % (ref 36.0–46.0)
Hemoglobin: 13.5 g/dL (ref 12.0–15.0)
MCH: 29.1 pg (ref 26.0–34.0)
MCH: 29.3 pg (ref 26.0–34.0)
MCH: 29.4 pg (ref 26.0–34.0)
MCHC: 31.5 g/dL (ref 30.0–36.0)
MCHC: 32.4 g/dL (ref 30.0–36.0)
MCV: 90.5 fL (ref 78.0–100.0)
MCV: 93.3 fL (ref 78.0–100.0)
Platelets: 246 10*3/uL (ref 150–400)
RBC: 4.26 MIL/uL (ref 3.87–5.11)
RDW: 13.7 % (ref 11.5–15.5)
RDW: 13.8 % (ref 11.5–15.5)
RDW: 13.9 % (ref 11.5–15.5)

## 2010-04-27 LAB — URINE CULTURE: Culture  Setup Time: 201110191006

## 2010-04-27 LAB — PROTIME-INR: Prothrombin Time: 14.1 seconds (ref 11.6–15.2)

## 2010-04-27 LAB — POCT I-STAT 3, VENOUS BLOOD GAS (G3P V)
O2 Saturation: 55 %
pCO2, Ven: 59.4 mmHg — ABNORMAL HIGH (ref 45.0–50.0)
pO2, Ven: 33 mmHg (ref 30.0–45.0)

## 2010-04-27 LAB — APTT: aPTT: 31 seconds (ref 24–37)

## 2010-04-27 LAB — POCT CARDIAC MARKERS: Myoglobin, poc: 75.5 ng/mL (ref 12–200)

## 2010-04-27 LAB — LIPID PANEL
Cholesterol: 123 mg/dL (ref 0–200)
LDL Cholesterol: 53 mg/dL (ref 0–99)
Total CHOL/HDL Ratio: 2.4 RATIO
Triglycerides: 89 mg/dL (ref <150)

## 2010-06-01 ENCOUNTER — Telehealth: Payer: Self-pay | Admitting: Internal Medicine

## 2010-06-02 ENCOUNTER — Telehealth: Payer: Self-pay | Admitting: *Deleted

## 2010-06-02 NOTE — Telephone Encounter (Signed)
Patient may drive per Dr. Ladona Ridgel.

## 2010-06-28 NOTE — Assessment & Plan Note (Signed)
[facility]                       [place] CARDIOLOGY OFFICE NOTE   NAME:[name], [name] WILLIG                   MRN:          [id number]  DATE:01/24/2008                            DOB:          27-Jun-1923    [name] comes in today per Dr. Simone Curia for increased  shortness of breath and dyspnea on exertion.   PROBLEM LIST:  1. Nonischemic cardiomyopathy, EF about 30%.  2. History of chronic systolic heart failure.  3. Status post BiV ICD insertion last seen by Dr. Ladona Ridgel on Jun 20, 2006.  4. Status post multiple ICD shock secondary to ventricular      fibrillation.  5. History of complete heart block.  6. History of syncope.   [name] over the past month has noted increased shortness of  breath.  She has even had some orthopnea.  She saw Dr. Gerda Diss first part  of November.  At that time, he increased her Lasix from 40 mg q.a.m. to  60 mg q.a.m.  Her weight remains about 3 pounds too heavy.  This is seen  on our scales and her scales.  At that time, he checked a BNP which is  151.2.   She denies any chest discomfort per se.  She has had no syncope.   Her meds are:  1. Furosemide 60 mg q.a.m.  2. Coreg 25 b.i.d.  3. Lumigan 0.03% nightly.  4. Benazepril 20 mg per day.  5. Fish oil 1200 mg every other day.  6. Simvastatin 40 mg nightly.  7. Calcium and vitamin D.  8. Centrum multivitamin.  9. Aspirin 325 mg p.r.n. for headaches.   PHYSICAL EXAMINATION:  VITAL SIGNS:  Her blood pressure is 112/72, her  pulse is 84 and regular.  EKG shows normal sinus rhythm with  biventricular pacing.  Her weight is 158 up 3 pounds on our scales since  May.  Respiratory rate is 18 and unlabored.  GENERAL:  She is in no acute distress.  SKIN:  Warm and dry.  NECK:  Some mild JVD.  Carotids upstrokes are equal bilaterally without  bruits.  There is no thyromegaly.  Trachea is midline.  HEENT:  Unremarkable.  LUNGS:  Clear to  auscultation and percussion with no rales or rhonchi.  HEART:  Displaced PMI.  Normal S1 and S2 without gallop.  ABDOMEN:  Soft, good bowel sounds.  EXTREMITIES:  No significant peripheral edema.  Pulses are intact.  NEUROLOGIC:  Intact.   ASSESSMENT:  Acute on chronic systolic heart failure.   PLAN:  1. Increase furosemide to 80 mg p.o. q.a.m.  2. Add potassium 20 mEq per day.  3. Check BMET today.  4. Continue other medication.  5. See me back in a week and half.     Thomas C. Daleen Squibb, MD, [facility]  Electronically Signed    TCW/MedQ  DD: 01/24/2008  DT: 01/25/2008  Job #: [id number]   cc:   [name], M.D.

## 2010-06-28 NOTE — Assessment & Plan Note (Signed)
[facility]                       [place] CARDIOLOGY OFFICE NOTE   NAME:[name], [name]                   MRN:          [id number]  DATE:02/03/2008                            DOB:          [birth year]    Ms. Jeanty comes in today for close followup of her acute on chronic  systolic heart failure.  On her exam, her lungs were relatively clear  and her lower extremity showed minimal if any edema.   Because of her dyspnea, we increased her furosemide to 80 mg p.o. q.a.m.  She has noted increased output.  Her weight however has stayed the same  on her scales and are also the same on our scales of 158 today.   She denies any orthopnea or PND.  She has had no chest discomfort.   A BMET was stable prior to increasing this furosemide.  We will repeat  one today as well as a BNP.   PHYSICAL EXAMINATION:  VITAL SIGNS:  Her blood pressure today is 100/62,  her pulse 72 and regular, weight is 158.  NECK:  No JVD.  LUNGS:  Clear to auscultation and percussion.  HEART:  Displaced PMI.  There is normal S1, S2.  No gallop.  ABDOMEN:  Soft.  EXTREMITIES:  No edema.  Pulses are present.   Ms. Judy feels better, but her weight is really unchanged.  She has  noted increased diuresis with a higher dose of furosemide or Lasix.   At this time, we will make no changes.  We will check a BMET and a BNP.  If she is not dropping her weight of pound or two in the next few weeks  and/or she is still having dyspnea on exertion or is worse, I will see  her back at that time.  Otherwise, I will see her back in 3 months.     Thomas C. Daleen Squibb, MD, [facility]  Electronically Signed    TCW/MedQ  DD: 02/03/2008  DT: 02/04/2008  Job #: [id number]   cc:   Donna Bernard, M.D.

## 2010-06-28 NOTE — Assessment & Plan Note (Signed)
[place] [facility]                         ELECTROPHYSIOLOGY OFFICE NOTE   NAME:[name], [name] SCHOLLE                   MRN:          [id number]  DATE:06/20/2006                            DOB:          09-Aug-1923    [name] returns today for followup.  She is a very pleasant elderly  woman with a non-ischemic cardiomyopathy and ventricular fibrillation,  status post ICD insertion.  She has congestive heart failure and left  bundle branch block, status post bi-V ICD insertion.   Today, she returns for followup.  She denies chest pain or shortness of  breath.  She is exercising regularly.  She has had no intercurrent ICD  therapies.   ON EXAM:  She is a pleasant, well-appearing, elderly woman, in no  distress.  Blood pressure 100/70 and the pulse 70 and regular.  Respirations were 18.  The weight was 148 pounds.  NECK:  Revealed no jugular venous distention.  LUNGS:  Clear bilaterally to auscultation, no wheezes, rales or rhonchi  were present.  CARDIOVASCULAR EXAM:  Revealed a regular rate and rhythm with normal S1  and S2.  The PMI was enlarged and laterally displaced.  EXTREMITIES:  Demonstrated no cyanosis, clubbing or edema.   Interrogation of her defibrillator demonstrates a Guidant Contact H-175,  the P-waves were 74, there were no R-waves, secondary to her complete  heart block.  Impedance was 562 in the atrium, 576 in the ventricle, 579  in the left ventricle.  Threshold 1.8 at 0.8 in the atrium, 1.4 at 0.5  in the right ventricle and 2.4 at 1 in the left ventricle.  The battery  voltage was 2.73 volts (MOL 1).   IMPRESSION:  1. Non-ischemic cardiomyopathy.  2. Congestive heart failure.  3. Status post bi-V ICD insertion.  4. Status post multiple ICD shocks, secondary to VF.   DISCUSSION:  Overall, [name] is stable and her defibrillator is  working normally.  Her heart block persists.  She is instructed to  continue her present  medications, except I have asked that she stop her  Digoxin, secondary to the recent recall.  I will see her back in one  year.     Doylene Canning. Ladona Ridgel, MD  Electronically Signed    GWT/MedQ  DD: 06/20/2006  DT: 06/20/2006  Job #: [id number]

## 2010-06-28 NOTE — Discharge Summary (Signed)
[name], [name]            ACCOUNT NO.:  [id number]   MEDICAL RECORD NO.:  [id number]          PATIENT TYPE:  AMB   LOCATION:  CATH                         FACILITY:  [facility]   PHYSICIAN:  Doylene Canning. Ladona Ridgel, MD    DATE OF BIRTH:  1924/01/19   DATE OF ADMISSION:  09/25/2008  DATE OF DISCHARGE:  09/25/2008                               DISCHARGE SUMMARY   PRIMARY CARE PHYSICIAN:  Donna Bernard, MD   FINAL DIAGNOSES:  1. Biventricular implantable cardioverter-defibrillator is at elective      replacement indicator.  2. Guidant H175 biventricular implantable cardioverter-defibrillator      explanted with implant of a [employer] COGNIS 100-D      biventricular implantable cardioverter-defibrillator, defibrillator      threshold study less than or equal to 14 joules, Dr. Lewayne Bunting.   SECONDARY DIAGNOSES:  1. History of nonischemic cardiomyopathy.  2. Implantable cardioverter-defibrillator implanted for dilated      cardiomyopathy, syncope/inducible ventricular tachycardia in EP      lab/New York Heart Association Class III chronic systolic      congestive heart failure.  3. Complete heart block.  4. New York Heart Association Class II chronic systolic congestive      heart failure, now improved from class III after biventricular      implantable cardioverter-defibrillator implantation.  5. Generator change, February 11, 2004.  6. The patient had successful shock termination of sustained      ventricular tachycardia in the past.  7. Blood pressure.  8. Dyslipidemia.   PROCEDURE:  Generator change out with implant of the [facility] [employer]  COGNIS 100-D BiV ICD, Dr. Lewayne Bunting.   BRIEF HISTORY:  [name] is an 77 year old female.  She has a  history of mixed cardiomyopathy.  She has a history of congestive heart  failure.  She has inducible ventricular tachycardia and has also had an  episode of sustained ventricular tachycardia requiring ICD discharge.  She  is status post BiV ICD with generator change in 2005, for the reason  she had generator change was device recall.   The patient presents to the office [facility] on September 23, 2008.  She has been stable.  She is not short of breath.  She has no chest  pain.  She does get dizzy and lightheaded when she bends over and stands  up real quick.  She denies any ICD therapies.  Her device has reached  ERI.  Interrogation of her device reveals only one nonsustained  ventricular tachycardia episode.   HOSPITAL COURSE:  The patient presents electively on September 25, 2008.  She underwent explantation of her existing BiV ICD with implant of the  [employer] COGNIS 100-D BiV ICD.  The patient is asked to remove her bandage  the morning of Saturday, September 26, 2008, to leave the incision open to  the air, incision must stay dry for the next 7 days.  The patient is to  sponge bathe until Friday, October 02, 2008.  She goes home with a new  medication and antibiotic, Keflex 250 mg 1 tablet 3 times daily.  She is  to resume her regular medications which are as follows.  1. Benazepril 20 mg daily.  2. Lasix 40 mg daily.  3. Carvedilol 25 mg twice daily.  4. Lumigan 0.03% ophthalmic solution, apply daily.  5. Centrum Silver multivitamin daily.  6. Simvastatin 40 mg daily at bedtime.  7. Klor-Con 10 mEq daily as potassium chloride.   LAB STUDIES:  Pertinent to this admission were drawn on September 23, 2008.  Complete blood count, white cell 7.5, hemoglobin 13.8, hematocrit 40.3,  and platelets of 212.  INR 1.0, PT is 13.5.  Sodium 138, potassium 4.4,  chloride 97, carbonate 34, glucose 85, BUN is 14, creatinine 0.78.   Time for this dictation and discussion with the patient, greater than 30  minutes.      Maple Mirza, PA      Doylene Canning. Ladona Ridgel, MD  Electronically Signed    GM/MEDQ  D:  09/25/2008  T:  09/26/2008  Job:  [id number]   cc:   Donna Bernard, M.D.

## 2010-06-28 NOTE — Op Note (Signed)
[name], [name]            ACCOUNT NO.:  [id number]   MEDICAL RECORD NO.:  [id number]          PATIENT TYPE:  AMB   LOCATION:  CATH                         FACILITY:  [facility]   PHYSICIAN:  Doylene Canning. Ladona Ridgel, MD    DATE OF BIRTH:  March 06, 1923   DATE OF PROCEDURE:  09/25/2008  DATE OF DISCHARGE:  09/25/2008                               OPERATIVE REPORT   PROCEDURE PERFORMED:  Removal of previous implanted biventricular  implantable cardioverter-defibrillator, which reached elective  replacement indicator followed by insertion of a new biventricular  implantable cardioverter-defibrillator.   INTRODUCTION:  The patient is an 77 year old woman with a longstanding  nonischemic cardiomyopathy, congestive heart failure, history of VF,  complete heart block, and class III now, class II heart failure.  She  has been quite stable over the years, having been shocked at VF from the  defibrillator on several occasions.  Her device had reached elective  replacement indication.  She is now referred for generator change and  defibrillation testing.   PROCEDURE:  After informed consent was obtained, the patient was taken  to diagnostic EP lab in fasting state.  After usual preparation and  draping, intravenous fentanyl and midazolam was given for sedation.  Lidocaine 30 mL was infiltrated in the left infraclavicular region.  A 7  cm incision was carried out over this region.  Electrocautery was  utilized to dissect down to the ICD pocket.  The subcutaneous pocket was  then opened with electrocautery and electrocautery was utilized to  dissect free the pacing leads.  At this point, the LV lead was  disconnected from its generator and hooked up the alligator clips for  pacing.  At this point then, the rest of the pacing leads were removed  from the generator and a new [employer] Cognis Bi-V ICD, serial  number [id number] was connected to the old right atrial and defibrillator  leads.  At this  point with assurance of pacing, the LV lead was then  disconnected from the alligator clips and hooked up to the new ICD lead.  The device was then placed back in the subcutaneous pocket.  The leads  were evaluated.  The P-waves were 5.  There were no R-waves secondary  complete heart block.  The impedance was 428 in the atrium, 462 in the  RV, and 493 in the LV.  Threshold was 1.3 and 0.4 in the atrium, 1.1 and  0.4 in the right ventricle and 2 and 1 in the left ventricle.  With  these satisfactory parameters, the patient was more deeply sedated and  defibrillation threshold testing carried out.   After the patient was more deeply sedated with fentanyl and Versed, VF  was induced with a T-wave shock.  A 14-joule shock was delivered  terminating VF and restoring sinus rhythm.  At this point, no additional  defibrillation threshold testing was carried out and the incision was  closed with 2-0 and 3-0 Vicryl.  Benzoin and Steri-Strips were painted  on the skin, pressure dressing was placed and the patient was returned  to her room in satisfactory condition.  There  were no immediate  procedure complications.  The full results demonstrate successful.  Removal of previously implanted Guidant Bi-V ICD followed by successful  insertion of a new device with defibrillation threshold testing.      Doylene Canning. Ladona Ridgel, MD  Electronically Signed     GWT/MEDQ  D:  09/25/2008  T:  09/26/2008  Job:  [id number]

## 2010-06-28 NOTE — Assessment & Plan Note (Signed)
[facility]                       [place] CARDIOLOGY OFFICE NOTE   NAME:[name], [name]                   MRN:          [id number]  DATE:05/12/2008                            DOB:          09-21-1923    [name] comes in today for chronic diastolic heart failure.   She has little bit of pedal edema at the end of the day from sitting too  much.  She is less short of breath and still has some mild dyspnea on  exertion.  Her weight is stable at 158.   She remains on;  1. Lasix 80 mg p.o. q.a.m.  2. She is on Coreg 25 b.i.d.  3. Benazepril 20 mg per day.  4. Simvastatin 40 mg q.h.s.  5. Potassium 20 mEq a day.  6. Calcium.  7. Vitamin D.  8. Centrum multivitamin.   She denies orthopnea or PND.  She has had no chest tightness or  pressure.  She has been out weeding her yard!   Her blood pressure today is 110/60, pulse 64 and regular, her weight is  158, which is stable from December.  NECK:  No JVD.  Carotids are full.  Thyroid is not enlarged.  Trachea is  midline.  There is no lymphadenopathy.  LUNGS:  Clear to auscultation and percussion.  No rhonchi, wheezes, or  rales.  HEART:  Nondisplaced PMI.  Soft S1 and S2.  No gallop.  ABDOMEN:  Soft.  Good bowel sounds.  No midline bruit.  EXTREMITIES:  Absolutely no edema.  Pulses are intact.  NEUROLOGIC:  Intact.  SKIN:  A few ecchymoses.   [name] is stable from my standpoint.  I noticed a little dry  clearing that she kept doing with her throat during her exam.  It could  be benazepril.  I have made aware of this, but she would like to stay  with this medication for now.  It may all just be allergies.   We made no changes in her medical therapy.  I will plan on seeing her  back in 3 months.      Thomas C. Daleen Squibb, MD, [facility]  Electronically Signed    TCW/MedQ  DD: 05/12/2008  DT: 05/13/2008  Job #: [id number]   cc:   Donna Bernard, M.D.

## 2010-06-28 NOTE — Assessment & Plan Note (Signed)
[facility]                         ELECTROPHYSIOLOGY OFFICE NOTE   NAME:[name]                   MRN:          [id number]  DATE:07/03/2007                            DOB:          01-Feb-1924    [name] returns today for follow-up.  She is a very pleasant  elderly woman with a history of mixed cardiomyopathy, congestive heart  failure and ventricular tachycardia who is status post BiV ICD  insertion.  She returns today for follow-up.  She has been stable.  She  denies chest pain.  She denies shortness of breath.  She does get dizzy  and lightheaded when she bends over and stands up real quick.  She  denies any intercurrent IC therapies.  She has very mild stinging  sensations occasionally at her ICD insertion site.   MEDICATIONS:  Today include:  1. Coreg 25 mg half tablet twice daily.  2. Lumigan.  3. Benazepril 20 mg daily.  4. Fish oil supplement.   On physical examination, she is a pleasant elderly-appearing woman in no  acute distress.  Blood pressure today was 144/70, the pulse 77 and  regular, respirations were 18.  The weight was 155 pounds.  NECK:  Revealed no jugular venous distention.  LUNGS:  Clear bilaterally to auscultation.  No wheezes, rales or rhonchi  were present.  CARDIOVASCULAR EXAM:  Revealed a regular rate and rhythm.  Normal S1-S2.  There were no murmurs, rubs or gallops today.  EXTREMITIES:  Demonstrate no cyanosis, clubbing or edema.  Pulses were  2+ and symmetric.   Interrogation of the defibrillator demonstrates a Guidant Contact H-175.  The P-waves were 5.  There are no R-waves secondary to pacemaker  dependence.  The impedance was 506 in the A, 549 in the RV, and 579 in  the LV.  The threshold 1.6 at 0.8 in the atrium, 1.4 at 0.5 in the right  ventricle, 2.4 at 1 in the left ventricle.  The battery voltage was 2.57  volts.  She was V paced 86% of the time.  She was not A pacing at all.  Her  underlying rhythm was complete heart block.   IMPRESSION:  1. Recurrent episodes of polymorphic VT.  2. History of syncope.  3. Complete heart block.  4. Dilated cardiomyopathy, ejection fraction 30%.   DISCUSSION:  Overall, [name] is stable, and her defibrillator is  working normally.  Her heart failure is well-controlled.  Her battery  voltage is going down, but she is not yet at ERI.  I  will plan to see her back in the office in 1 year, and she will follow  up in our latitude program in 3 months.     [name]. [name] Ridgel, MD  Electronically Signed    GWT/MedQ  DD: 07/03/2007  DT: 07/03/2007  Job #: [id number]

## 2010-07-01 NOTE — Discharge Summary (Signed)
NAME:  [name], [name]                      ACCOUNT NO.:  [id number]   MEDICAL RECORD NO.:  [id number]                   PATIENT TYPE:  INP   LOCATION:  3701                                 FACILITY:  [facility]   PHYSICIAN:  DIANE SORROW, C.R.N.P.              DATE OF BIRTH:  [birth year]   DATE OF ADMISSION:  09/02/2001  DATE OF DISCHARGE:  09/09/2001                                 DISCHARGE SUMMARY   PRIMARY DIAGNOSIS:  Nonsustained ventricular tachycardia.   HOSPITAL COURSE:  This is a 77 year old female with a past medical history  of CHF, nonischemic cardiomyopathy, and hyperlipidemia, who presented with a  complaint of weakness and fatigue with presyncope.  The first started 1-1/2  to 2 years ago.  She would feel as if she would pass out, but has never had  a loss of consciousness.  Next week prior to admission, she had a complaint  of chest heaviness, a feeling of being hot.  She was taken to [facility].  The pain was relieved with nitroglycerine.  She was admitted at that time  and a 2-D echocardiogram was also done.  The patient had an event monitor  placed, which showed nonsustained VT and had complained of chest heaviness  at that time with presyncope.  The patient on telemetry now is in normal  sinus rhythm with a first degree AV block.  The patient was transferred to  [facility].  She has an ejection fraction of approximately 15%.  During hospitalization, the patient had an EP consult.  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  It was decided  that the patient would undergo a DDD ICD Bi-V pacemaker.  The patient  underwent placement of a Guidant type Bi-V ICD.  She tolerated the procedure  well and had no immediate postoperative complications.  The patient was  placed on amiodarone therapy.  Her follow-up values were within normal  limits.  A PFT with  DLCO was done at the day of discharge.  Results are not  available at this time.  A TSH was 2.064.  The patient was discharged to  home on the following medications.   MEDICATIONS:  1. Lotensin 20 mg daily.  2. Digoxin 0.125 daily.  3. Lipitor 10 nightly.  4. Lasix 40 daily.  5. Coreg 12.5 twice a day.  The office has samples to provide for her.  6. Amiodarone 200 daily.  7. Eye drops.  8. Fosamax as before.   DISCHARGE INSTRUCTIONS:  She was instructed if her defibrillator fired one  time and she was okay to call the office; one time and not okay twice in a  row or three times in one day to call 911.  She is not to do any heavy  lifting or strenuous activity with her left arm for 4-6  weeks and not to  raise her left arm above her head for one week.  She  was not to shower or get her wound wet for one week.  Low-fat, low-salt  diet.  She was to keep her previously scheduled appointment with Dr. Daleen Squibb on  August 11 and an appointment was scheduled with Dr. Ladona Ridgel in the [place]  office on October 28 at 2:30 p.m.                                                DIANE SORROW, C.R.N.P.    DS/MEDQ  D:  09/09/2001  T:  09/15/2001  Job:  [id number]   cc:   Thomas C. Daleen Squibb, M.D. [facility]   Doylene Canning. Ladona Ridgel, M.D. [facility]   [facility], [place]   Dr. Gerda Diss, [place]

## 2010-07-01 NOTE — Discharge Summary (Signed)
[name], [name]            ACCOUNT NO.:  [id number]   MEDICAL RECORD NO.:  [id number]          PATIENT TYPE:  INP   LOCATION:  2899                         FACILITY:  [facility]   PHYSICIAN:  Doylene Canning. Ladona Ridgel, M.D.  DATE OF BIRTH:  [birth year]   DATE OF ADMISSION:  02/11/2004  DATE OF DISCHARGE:  02/11/2004                                 DISCHARGE SUMMARY   DISCHARGE DIAGNOSES:  1.  Status post Guidant Contact Model [id number] biventricular/ICD with shock      therapy delivered January, 2005.  2.  Status post generator change with implantation of Guidant Contact      Renewal 3, Model H175 with defibrillator threshold study less than or      equal to 14 joules.   SECONDARY DIAGNOSES:  1.  Nonischemic cardiomyopathy.  2.  Congestive heart failure.  3.  History of ventricular tachycardia.  4.  ICD therapy delivered in January, 2005 for sustained ventricular      tachycardia.  5.  Status post implantation of Guidant Contact ICD/BiV with left      ventricular lead placement.   PROCEDURE:  On January 12, 2004, removal of previous Guidant generator and  replacement with [employer] 3, Model H175 by Dr. Lewayne Bunting.  Patient had no complications after the procedure and was discharged the same  day.   BRIEF HISTORY:  The patient was a very pleasant 77 year old female who has a  history of nonischemic cardiomyopathy with syncope and inducible ventricular  tachycardia.  She was status post biventricular/ICD implantation secondary  to the above and also secondary to congestive heart failure.  The patient  denies any chest pain or shortness of breath and overall has been stable.  She has very minimal intermittent discomfort in the left infraclavicular  region at the implantable cardioverter site.  Interrogation of the  defibrillator demonstrates no intercurrent implantable cardioverter  defibrillator therapies.  The patient did have therapy delivered on January,  2005 for rapid  ventricular tachycardia, which was sustained.  The patient at  this time wishes to discuss generator change, in light of her understanding  that some of the older Guidant models had a very low but possible failure to  deliver therapy rate.   DISCHARGE DISPOSITION:  1.  Patient is discharged on the following medications:  Keflex 1 tab at      breakfast, lunch, or dinner and bedtime for the next 5 days, Lotensin 20      mg daily, Lasix 40 mg daily, Lipitor 10 mg daily at bedtime, Coreg 12.5      mg twice daily, Lumigan eye drops 1 drop in both eyes at bedtime,      Centrum Silver daily, Lanoxin 0.125 mg daily, calcium 600 mg daily,      Brewer's yeast daily.  2.  The patient is to keep her incision dry for the next week.  She is to      sponge bathe until Thursday, February 18, 2004, when she can recommence shower or tub baths.  3.  She is to follow up at [facility] at [address] on      Thursday, February 25, 2004 at 9:15 in the morning.       GM/MEDQ  D:  02/11/2004  T:  02/11/2004  Job:  [id number]   cc:   Donna Bernard, M.D.  [address]  [place]  [place] [address]  Fax: [phone]

## 2010-07-01 NOTE — H&P (Signed)
[name], [name]            ACCOUNT NO.:  [id number]   MEDICAL RECORD NO.:  [id number]          PATIENT TYPE:  INP   LOCATION:  2899                         FACILITY:  [facility]   PHYSICIAN:  Doylene Canning. Ladona Ridgel, M.D.  DATE OF BIRTH:  1923-07-11   DATE OF ADMISSION:  02/11/2004  DATE OF DISCHARGE:                                HISTORY & PHYSICAL   CARDIOLOGIST:  Dr. [place] Bing.   ELECTROPHYSIOLOGIST:  Dr. Sharrell Ku.   PRIMARY CAREGIVER:  Dr. Kari Baars.   PRESENTING CIRCUMSTANCE:  I am here to have my defibrillator changed.   HISTORY OF PRESENT ILLNESS:  [name] is a 77 year old female.  She has  a Guidant cardioverter/defibrillator with biventricular upgrade which was  placed in 2003 by Dr. Ladona Ridgel.  It was placed because the patient had marked  nonischemic cardiomyopathy.  This was demonstrated on catheterization done  in 1997.  She also has a history of near syncope, class III congestive heart  failure with decompensation at the time of the catheterization in 1997.  She  also was found to have inducible nonsustained ventricular tachycardias and  left bundle branch block.  Of note, she has had no decompensation of  congestive heart failure symptoms since implantation.  However, on 03/15/03,  a date well remembered by this patient, she was attending a movie with some  friends.  She felt her vision going dim.  She slumped forward according to  witnesses and then suddenly jerked back.  Per the patient she was losing  consciousness and then suddenly there she was watching the moving again.  She has no recollection of the device having delivered therapy; however,  later subsequent followup at the office the next day at [facility]  showed that the device had delivered appropriate therapy for sustained  ventricular tachycardia.  The patient now returns for generator change, this  being a Guidant device.   ALLERGIES:  The patient has no known drug allergies.   PAST MEDICAL HISTORY:  1.  Nonischemic cardiomyopathy, ejection fraction 11% on catheterization      1997.  2.  Class III congestive heart failure with decompensation in 1997.  3.  Inducible nonsustained ventricular tachycardia.  4.  Status post implantable cardioverter/defibrillatory with biventricular      upgrade Guidant device.  5.  Left bundle branch block.  6.  History of near syncope.  7.  History of cardioverter/defibrillator therapy 1/05 with no sequelae.  8.  Hyperlipidemia.   MEDICATIONS:  1.  Lotensin.  2.  Digoxin.  3.  Coreg.  4.  Lipitor.  5.  Lasix.   SOCIAL HISTORY:  The patient lives in [place].  She is retired and a  widow.  She does not smoke or drink.   FAMILY HISTORY:  Mother died at age 88 of unknown causes.  Father died in  his 21s, cause unknown.   REVIEW OF SYSTEMS:  CONSTITUTIONAL:  The patient denies any recent  presyncope or syncopal events.  She did have ICD therapy delivered 1/05  which was approximately 12 months ago.  She denies arthralgias,  gastrointestinal symptoms.  NEUROLOGICAL:  No deficits except for a benign  essential tremor which seems per patient to be progressing.  No  cerebrovascular accident.  ABDOMEN:  She does not have gastroesophageal  reflux problems.  She has never had any history of gastrointestinal bleed.  ENDOCRINE:  She does not have history of diabetes.  PULMONARY:  No pulmonary  embolism or deep venous thrombosis.  CARDIOVASCULAR:  No prior myocardial  infarction.   PHYSICAL EXAMINATION:  GENERAL:  The patient is alert and oriented x3.  She  is in no acute distress.  VITAL SIGNS:  Her blood pressure is 134/78, heart rate 82 and regular,  respiratory rate 20.  Temperature 98.7.  HEENT:  Eyes:  Pupils equal, round, reactive to light.  Extraocular  movements are intact.  Nares without discharge and patent.  The oropharynx  shows the mucous membranes are pink, most without lesion or erythema.  NECK:  Supple.  No  carotid bruits auscultated.  No cervical lymphadenopathy.  CHEST:  Lungs are clear to auscultation and percussion bilaterally.  HEART:  Regular rate and rhythm without murmur.  ABDOMEN:  Soft, nondistended, bowel sounds present.  No hepatosplenomegaly.  Abdominal aorta is nonpulsatile.  EXTREMITIES:  Excellent distal pulses.  Dorsalis pedis pulses are 4/4  bilaterally.  Radial pulses 4/4 bilaterally.  There is no evidence of  clubbing, cyanosis or edema.  She does have a slight vertical incision at  the left ankle where she did have a prior fracture.  NEUROLOGICAL:  The patient does ambulate independently well if she has no  circumscribing of her daily activities.  She is not complaining of increased  shortness of breath or dyspnea on exertion.  She has no orthopnea, no  paroxysmal nocturnal dyspnea.  No lower extremity edema.  No feeling of  palpitation.   PLAN:  Generator change out today by Dr. Sharrell Ku, replacement of Guidant  Contak H135 biventricular defibrillator.       GM/MEDQ  D:  02/11/2004  T:  02/11/2004  Job:  [id number]   cc:   [place] Bing, M.D.

## 2010-07-01 NOTE — Consult Note (Signed)
[facility]  Patient:    [name], [name] Visit Number: [id number] MRN: [id number]          Service Type: MED Location: [phone] Attending Physician:  Mirian Mo Dictated by:   Doylene Canning. Ladona Ridgel, M.D. [facility] Proc. Date: 09/04/01 Admit Date:  09/02/2001   CC:         Loran Senters, M.D.  Kathrine Cords, [facility]   Consultation Report  ELECTROPHYSIOLOGY CARDIOLOGY CONSULTATION  REASON FOR CONSULTATION:  Evaluation of nonsustained ventricular tachycardia in patient with history of dizzy spells, (near syncope) with documented nonsustained ventricular tachycardia.  HISTORY OF PRESENT ILLNESS:  The patient is a very pleasant 77 year old woman who was diagnosed with congestive heart failure approximately 5 years ago.  At that time she underwent catheterization demonstrating no obstructive coronary disease.  She had done amazingly well for several years but over the last several months has had increasing dyspnea with exertion.  Prior to three to four months ago she was able to walk one mile without stopping and mow the grass and do her usual household chores.  She notes over the last few weeks her energy level has dropped  and she has, in fact, had to stop several times with walking and is unable to mow the grass.  She has a set of stairs in her house and can manage to climb the stairs but is fatigued at the top of the stairway.  The patient has had recurrent episodes of dizziness and subsequently had a cardiac monitor obtained which demonstrated episodes of nonsustained VT.  This is also associated with chest heaviness.  She was admitted to the hospital for additional evaluation.  PAST MEDICAL HISTORY:  As previously noted.  In addition she has a history of hyperlipidemia.  Her ejection fraction was 15 to 20%.  MEDICATIONS ON ADMISSION: 1. Lotensin 20 mg q.d. 2. Digoxin 0.125 mg q.d. 3. Zocor 20 mg q.d. 4. Coreg 6.25 mg  q.12h. 5. Lasix 40 mg q.d.  SOCIAL HISTORY:  The patient lives in [place].  She denies ethanol or tobacco abuse.  She is retired.  FAMILY HISTORY:  Notable for mother dying at age 89 of unknown causes and father dying in 20s of unknown causes.  REVIEW OF SYSTEMS:  Notable for no fever, chills, night sweats, weight changes or adenopathy.  She denies headache, vision or hearing problems.  She denies chest pain, paroxysmal nocturnal dyspnea, orthopnea.  She does have presyncope as previously noted.  She denies syncope or claudication, cough or wheezing. She denies arthralgia or myalgias.  She denies dysuria, hematuria or nocturia.  She denies numbness, depression or anxiety but does admit to generalized weakness and fatigue.  She denies nausea, vomiting and diarrhea or constipation or abdominal pain.  She denies polyuria, polydipsia, skin or hair changes.  PHYSICAL EXAMINATION: GENERAL:  She is a pleasant, elderly appearing woman in no distress.  VITAL SIGNS:  Blood pressure 118/70. Pulse 78 to 88 and regular.  Respirations 20.  Temperature 97.4.  HEENT:  Normocephalic, atraumatic.  Pupils are equal, round. Oropharynx moist.  NECK:  Reveals no jugular venous distension.  The carotids were 1+ and symmetric.  CARDIOVASCULAR:  Regular rate and rhythm with normal S1 and S2.  PMI was enlarged and laterally displaced.  I did not hear any S3 or S4 gallops.  LUNGS:  Clear bilaterally to auscultation.  No wheezes, rales or rhonchi.  ABDOMEN:  Soft, nontender, nondistended.  There is no organomegaly.  EXTREMITIES:  No clubbing, cyanosis, or edema.  There are no rashes or petechial lesions.  NEUROLOGICAL:  She is alert and oriented x 3 with Cranial nerves II-XII grossly intact.  Strength was 4+/5 and symmetric.  LABORATORY DATA:  Electrocardiogram demonstrates normal sinus rhythm with first degree atrioventricular block and left bundle branch block.  IMPRESSION: 1. Nonsustained  ventricular tachycardia associated with near syncope,    cycling to 440 milliseconds. 2. Chronic left bundle branch block with first degree atrioventricular block. 3. Nonischemic cardiomyopathy. 4. Chest heaviness associated with #1. 5. History of congestive heart failure presently class 2 to 3.  DISCUSSION:  I have discussed the treatment options with the patient.  I think proceeding with electrophysiology testing is warranted.  I am concerned that with her wide left bundle branch block and first degree heart block that she might be benefited by consideration of biventricular pacing.  If her EP study were positive then we would consider biventricular ICD implantation.  I will defer the question of her congestive heart failure severity to her primary cardiologist and we will decide whether to proceed with biventricular pacing pending the results of her EP study. Dictated by:   Doylene Canning. Ladona Ridgel, M.D. [facility] Attending Physician:  Mirian Mo DD:  09/04/01 TD:  09/08/01 Job: [id number]

## 2010-07-01 NOTE — Procedures (Signed)
   [name], [name]                         ACCOUNT NO.:  [id number]   MEDICAL RECORD NO.:  [id number]                    PATIENT TYPE:   LOCATION:                                       FACILITY:   PHYSICIAN:  Edward L. Juanetta Gosling, M.D.             DATE OF BIRTH:   DATE OF PROCEDURE:  DATE OF DISCHARGE:                                EKG INTERPRETATION   TIME AND DATE:  1555 on 08/29/01   INTERPRETATION:  The rhythm is sinus rhythm with a rate in the 80s.  There  is left bundle branch block.  First degree AV block is seen.   IMPRESSION:  Abnormal electrocardiogram.                                               Edward L. Juanetta Gosling, M.D.    ELH/MEDQ  D:  01/08/2002  T:  01/09/2002  Job:  [id number]

## 2010-07-01 NOTE — H&P (Signed)
[facility]  Patient:    [name], [name] Visit Number: [id number] MRN: [id number]          Service Type: MED Location: 3700 [id number] Attending Physician:  Mirian Mo Dictated by:   Donna Bernard, M.D. Admit Date:  09/02/2001                           History and Physical  CHIEF COMPLAINT:  Chest pain.  SUBJECTIVE:  This patient is a 77 year old white female with a history of congestive heart failure, hypercholesterolemia, prior tachyarrhythmia, and essential tremor, along with anxiety, who arrives at the emergency room the day of admission with complaints of significant chest discomfort.  The patient was playing cards when she developed a sense of tightness in her chest; this was accompanied by shortness of breath and the patient became very anxious and worried about her symptoms.  She states she felt like she was "going out of here."  The patient had no nausea and no diaphoresis.  Recently, she had been seen by the cardiologist due to progressive dyspnea and fatigue, with apparent worsening of her CHF via echocardiogram.  This, the patient will admit, has led to increased anxiety about her cardiac situation.  MEDICATIONS:  The patient claims compliance with her medications which include: 1. Fosamax 70 mg weekly. 2. Calcium plus vitamin D two tablets daily. 3. Centrum one daily. 4. Lotensin 20 mg daily. 5. Lipitor 10 mg daily. 6. Coreg 3.125 mg b.i.d. 7. Lasix 40 mg daily. 8. Lanoxin -- new dose at 0.125 mg daily. 9. Clarinex p.r.n.  PRIOR SURGERIES:  Remote tonsillectomy; hysterectomy; bladder surgery; ankle surgery; heart catheterization in 1997, reportedly normal coronary arteries, actual report unavailable at this time.  FAMILY HISTORY:  Positive for lung cancer, asthma, elevated cholesterol.  SOCIAL HISTORY:  The patient is widowed and retired.  No tobacco or alcohol use.  REVIEW OF SYSTEMS:  Otherwise  negative.  PHYSICAL EXAMINATION:  VITAL SIGNS:  BP 120/86.  Afebrile.  Pulse 80.  GENERAL:  Alert, tearful.  No other acute distress.  HEENT:  Normal.  NECK:  No JVD.  LUNGS:  Clear.  HEART:  ______.  CHEST:  Chest wall -- no obvious tenderness.  ABDOMEN:  No obvious tenderness.  No rebound.  No guarding.  EXTREMITIES:  Normal.  No peripheral edema.  SIGNIFICANT LABORATORY AND ACCESSORY DATA:  Cardiac enzymes normal.  EKG:  Left bundle branch block, normal sinus rhythm.  IMPRESSION: 1. Chest pain with reportedly normal coronary arteries five years ago and    excellent control of cholesterol status since then.  The patient does admit    to some anxiety and worry, with recent further workup revolving around    problem #2. 2. Congestive heart failure, clinically stable at this time, though    significant with an ejection fraction down to 15%. 3. Hypercholesterolemia. 4. Anxiety. 5. Essential tremor.  PLAN:  We will admit for observation, serial enzymes, telemetry, cardiac consultation, further orders as noted in the chart. Dictated by:   Donna Bernard, M.D. Attending Physician:  Mirian Mo DD:  08/30/01 TD:  09/02/01 Job: [id number]

## 2010-07-01 NOTE — Discharge Summary (Signed)
   NAME:  [name], [name]                      ACCOUNT NO.:  [id number]   MEDICAL RECORD NO.:  [id number]                   PATIENT TYPE:  INP   LOCATION:  A211                                 FACILITY:  [facility]   PHYSICIAN:  Donna Bernard, M.D.             DATE OF BIRTH:  03-Mar-1923   DATE OF ADMISSION:  08/29/2001  DATE OF DISCHARGE:  08/31/2001                                 DISCHARGE SUMMARY   FINAL DIAGNOSES:  1. Chest pain.  2. Near syncope.  3. History of palpitations.  4. Severe congestive heart failure.  5. Hypercholesterolemia.  6. Anxiety.  7. Sustained run of ventricular tachycardia.   FINAL DISPOSITION:  1. The patient discharged to home.  2. Discharge medications:  Coreg 6.25 mg 1 p.o. b.i.d.  3. Follow up in the office in one week.   HISTORY AND PHYSICAL:  Please see H&P as dictated.   HOSPITAL COURSE:  This patient is a 77 year old white female with a history  of congestive heart failure, hypercholesterolemia, prior tachyarrhythmias  including PVC's, essential tremor, and anxiety who presented to the  emergency room the day of admission with complaints of chest discomfort.  She had had a coronary catheterization approximately five years ago that  showed normal arteries.  The patient had been experiencing some near-  syncopal type events and also at times palpitations.  She was admitted to  the hospital.  We monitored her heart.  Serial cardiac enzymes were  performed.  These proved to be negative.  The cardiologists were consulted.  They assessed the patient.  Serial EKG's revealed left bundle branch block.  The cardiologists evaluated the patient.  They felt that she needed no  further workup for ischemia; however, they recommended titrating the patient  on to Coreg to help control the PVC's.  Of note, in the early hours prior to  the day of discharge, the patient did experience a run of ventricular  tachycardia, including a six-beat run.  I called the  cardiologist on-call.  This was a Saturday morning.  He felt, with the patient's history, it was  still appropriate not to press on with consideration of defibrillators, etc.  at this time.  The patient was discharged home with diagnosis and  disposition as noted above.                                                  Donna Bernard, M.D.    WSL/MEDQ  D:  10/29/2001  T:  10/30/2001  Job:  [id number]

## 2010-07-01 NOTE — Op Note (Signed)
NAMEPAGE, PUCCIARELLI            ACCOUNT NO.:  [id number]   MEDICAL RECORD NO.:  [id number]          PATIENT TYPE:  INP   LOCATION:  2899                         FACILITY:  [facility]   PHYSICIAN:  Doylene Canning. Ladona Ridgel, M.D.  DATE OF BIRTH:  October 01, 1923   DATE OF PROCEDURE:  02/11/2004  DATE OF DISCHARGE:                                 OPERATIVE REPORT   PROCEDURE PERFORMED:  Removal of a previously implanted Guidant  biventricular ICD and insertion of a new Guidant biventricular ICD.   INDICATIONS FOR PROCEDURE:  Nonischemic cardiomyopathy with history of VF  with successful defibrillation in a patient with a recall ICD and complete  heart block.   I:  INTRODUCTION:  The patient is a 77 year old woman with history of  dilated cardiomyopathy, syncope, ventricular tachycardia, and complete heart  block.  She has undergone successful defibrillation therapy to her ICD.  Her  heart failure improved from class 3 to class 2 after bi-V ICD implantation.  She has a Programmer, multimedia device which is on recall and with an  increased risk for failure and because she has had previous ICD life saving  therapies and has complete heart block, she is now referred for ICD  generator change.   II:  PROCEDURE:  After informed consent was obtained, the patient was taken  to the diagnostic EP lab in a fasting state.  After the usual preparation  and draping, intravenous Fentanyl and Midazolam was given for sedation.  30  mL of lidocaine was infiltrated into the left infraclavicular region.  A 9  cm incision was carried out over the old ICD incision site and  electrocautery utilized to dissect down to the fascial plane.  The  previously implanted ICD pocket was then entered without difficulty and the  old Guidant H135 biventricular device was removed.  The leads were removed  from the generator and tested.  The new Guidant H175 biventricular  defibrillator, serial number [id number], was connected to the right  atrial,  right ventricular, and left ventricular pacing leads and placed back in the  subcutaneous pocket.  The leads were then tested.  The patient was deeply  sedated and defibrillation threshold testing carried out.  After the patient  was more deeply sedated with Fentanyl and Versed, VF was induced with a T  wave shock.  A 14 joule shock was then delivered terminating ventricular  fibrillation and restoring sinus rhythm.  Because of the previously  successful DFT test in the past with stable pacing leads, no additional DFT  testing was carried out and the incision was then closed after Kanamycin  irrigation was utilized to irrigate the pocket with a layer of 2-0 Vicryl  followed by a layer of 3-0 Vicryl followed by a layer of 4-0 Vicryl.  Benzoin was painted on the skin, Steri-Strips were applied, and a pressure  dressing placed.  The patient was returned to her room in satisfactory  condition.   III:  COMPLICATIONS:  There were no immediate procedure complications.   IV:  RESULTS:  Successful removal of a previously implanted Guidant  biventricular ICD at elective  recall and insertion of a new Guidant  biventricular ICD in a patient with nonischemic cardiomyopathy, syncope,  status post successful defibrillation for ventricular fibrillation and  congestive heart failure.       GWT/MEDQ  D:  02/11/2004  T:  02/11/2004  Job:  [id number]   cc:   Thomas C. Daleen Squibb, M.D.   Donna Bernard, M.D.  [address]  [place]  [place] [address]  Fax: [phone]

## 2010-07-01 NOTE — Procedures (Signed)
[place]. [facility]  Patient:    [name], [name] Visit Number: [id number] MRN: [id number]          Service Type: MED Location: [phone] Attending Physician:  Mirian Mo Dictated by:   Doylene Canning. Ladona Ridgel, M.D. [facility] Proc. Date: 09/04/01 Admit Date:  09/02/2001   CC:         Thomas C. Daleen Squibb, M.D. [facility]  Loran Senters, M.D.  Kathrine Cords at [facility]   Procedure Report  PROCEDURE PERFORMED:  Invasive electrophysiologic testing.  SURGEON:  Doylene Canning. Ladona Ridgel, M.D.  INDICATIONS:  Near syncope with nonsustained VT and non-ischemic cardiomyopathy.  INTRODUCTION:  The patient is a very pleasant 77 year old woman with a history of non-ischemic cardiomyopathy diagnosed in 1998.  She has severe LV dysfunction.  Up until recently, her heart failure symptoms were very mild at most, although over the last several weeks, she has noted increasing dyspnea, and the inability to do many of her acts of daily living.  She has recurrent episodes of near syncope with dizzy spells, and she subsequently had a 30 day monitor which demonstrated runs of nonsustained VT, correlating with her spells.  She was admitted to the hospital for additional evaluation.  She has left bundle branch block with first degree A-V block.  She is now referred for electrophysiologic testing.  DESCRIPTION OF PROCEDURE:  After informed consent was obtained, the patient was taken to the diagnostic EP lab in a fasting state.  After the usual preparation and draping, intravenous fentanyl and midazolam was given for sedation.  A 5-French quadripolar catheter was inserted percutaneously in the right femoral vein and advanced to the R-V apex.  A 5-French quadripolar catheter was inserted percutaneously in the right femoral vein and advanced to the His bundle region.  After measurement of the basic intervals, rapid ventricular pacing was carried out demonstrating V-A dissociation at 600  msec. The pacing rate was subsequently stepwise decreased down to 250 msec which resulted in the initiation of sustained monomorphic VT.  This was hemodynamically unstable, associated with loss of consciousness.  The tachycardia was pace terminated.  At this point, programmed ventricular stimulation was carried out from the R-V apex with a basic drive cycle length of 324 msec and 400 msec.  The S1 and S2, S1 and S2 and S3, and S1 and S2 and S3 and S4 stimuli were delivered with the S1 and S2, S2 and S3, and S3 and S4 interval, stepwise decreased down to ventricular refractoriness.  During programmed ventricular stimulation, there was no inducible sustained VT.  At this point, the catheter was maneuvered into the right atrium, and rapid atrial pacing was carried out, and the right atrium had a pacing cycle length of 600 msec, and stepwise decreased down to 410 msec where A-V Wenckebach was observed.  During rapid atrial pacing, the A-V Wenckebach cycle length was 410 msec.  During rapid atrial pacing, there was no inducible tachycardia.  Next, programmed atrial stimulation was carried out from the high right atrium at a basic drive cycle length of 401 msec.  The S1 and S2 interval was stepwise decreased from 540 msec down to 410 msec where the A-V node ERP was observed.  During programmed atrial stimulation, there were no A-H jumps, no echo beats, and no inducible tachycardia.  Next, catheters were removed, hemostasis was assured, and the patient returned to her room in satisfactory condition.  COMPLICATIONS:  There were no immediate procedural complications.  RESULTS: A.  Baseline ECG.  The baseline ECG demonstrates normal sinus rhythm with left    bundle branch block and first degree A-V block. B. Baseline intervals.  Sinus node cycle length was 796 msec.  QRS duration    204 msec, P-R interval 213 msec, Q-T interval 491 msec, and the H-V    interval was 95 msec. C. Rapid  ventricular pacing.  Rapid ventricular pacing was carried out from    the R-V apex with a pacing cycle length of 600 msec.  This demonstrated a    V-A Wenckebach.  Pacing interval was stepwise decreased down to 250 msec    where there was inducible monomorphic VT. D. Programmed ventricular stimulation.  Programmed ventricular stimulation was    carried out from the R-V apex with a basic drive cycle length of 355 msec    as well 400 msec.  The S1 and S2, S1 and S2 and S3, and S1 and S2 and    S3 and S4 stimuli were delivered with the S1 and S2, S2 and S3, and the    S3 and S4 interval stepwise decreased down to ventricular refractoriness.    During programmed ventricular stimulation from the R-V apex, there was no    inducible VT. E. Rapid atrial pacing.  Rapid atrial pacing was carried out from the high    right atrium with a pacing cycle length of 600 msec and stepwise decreased    down to 410 msec where A-V Wenckebach was observed.  During rapid atrial    pacing, the P-R interval remained less than the R-R interval, and there was    no inducible SVT. F. Programmed atrial stimulation.  Programmed atrial stimulation was carried    out from the high right atrium with a basic drive cycle length of 732 msec.     The S1 and S2 interval was stepwise decreased from 540 msec down to    410 msec where the A-V node ERP was observed.  During programmed atrial    stimulation, there were no A-H jumps, no echo beats, and no inducible    tachycardia. G. Arrhythmia is observed.    1. VT initiation of rapid ventricular pacing, duration sustained at       termination of rapid ventricular pacing.  QRS morphology - right bundle       left superior axis.  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  In addition, there was inducible monomorphic ventricular tachycardia which could be pace terminable.  The  patient will be recommended to  proceed with a dual chamber ICD implantation plus or minus an LV lead. Dictated by:   Doylene Canning. Ladona Ridgel, M.D. [facility] Attending Physician:  Mirian Mo DD:  09/04/01 TD:  09/07/01 Job: [id number]

## 2010-07-01 NOTE — H&P (Signed)
[place]. [facility]  Patient:    SCHUYLER, BEHAN Visit Number: [id number] MRN: [id number]          Service Type: MED Location: [phone] Attending Physician:  Mirian Mo Dictated by:   Arturo Morton Riley Kill, M.D. [facility] Admit Date:  09/02/2001                           History and Physical  CHIEF COMPLAINT:  Chest pain, feeling faint.  HISTORY OF PRESENT ILLNESS:  The patient is a 77 year old white female.  She apparently carries a history of non-ischemic cardiomyopathy.  She apparently had a cardiac catheterization done in 1996, which did not demonstrate coronary artery disease.  Apparently, she has had a recent echocardiogram and the daughter says that she has an ejection fraction of 15%.  She has been on a monitor and called with an episode of weakness today and was demonstrated to have a run of wide complex tachycardia.  There were 17 beats at a rate of approximately 180.  As a result, she was called and told to come to the emergency room.  She has not felt well since being switched from atenolol to Coreg.  She thinks this may have some bearing on how she feels.  PAST MEDICAL HISTORY:  The patient has had congestive heart failure.  She has a cardiomyopathy with an ejection fraction of 15%.  She has had hyperlipidemia.  ALLERGIES:  There are no known drug allergies.  MEDICATIONS: 1. Lotensin 20 mg q.d. 2. Digoxin 0.25 mg, now on 1/2 tablet daily. 3. Lipitor 10 mg daily. 4. Coreg 12.5 mg p.o. b.i.d. 5. Lasix 40 mg daily.  FAMILY HISTORY:  Mother died at 74 of unknown causes.  Father died in his 61s of unknown cause and advanced age.  SOCIAL HISTORY:  The patient lives in [place].  She is retired.  She is widowed.  She does not smoke or drink.  REVIEW OF SYSTEMS:  Remarkable for presyncope.  She denies arthralgias or GI symptoms.  It is otherwise negative.  PHYSICAL EXAMINATION:  GENERAL:  She is alert, pleasant, and in no  acute distress.  VITAL SIGNS:  Blood pressure 122/67, respiratory rate 18, pulse 85, temperature 98.7.  The saturation is 97% on room air.  She is generally within normal limits, appears normal.  NECK:  There is no lymphadenopathy.  The neck is supple.  LUNGS:  Clear to auscultation and percussion.  CARDIAC:  The cardiac rhythm is regular and heart sounds are really quite distant.  ABDOMEN:  Soft.  EXTREMITIES:  No edema.  LABORATORY DATA:  Chest x-ray is pending.  EKG reveals normal sinus rhythm with left bundle branch block.  Review of telemetry slips reveal an interventricular conduction delay with occasional PVCs and then a wide complex run of approximately 17 beats.  IMPRESSION: 1. Nonsustained wide complex tachycardia. 2. History of non-ischemic cardiomyopathy by history. 3. Presyncope. 4. Hyperlipidemia.  PLAN:  The patient will be admitted for further observation.  She will likely need electrophysiologic evaluation.  Whether or not she needs further diagnostic catheterization we will defer to Dr. Daleen Squibb and we will try to obtain her echocardiographic results from [facility]. Dictated by:   Arturo Morton Riley Kill, M.D. [facility] Attending Physician:  Mirian Mo DD:  09/02/01 TD:  09/05/01 Job: [id number]

## 2010-07-01 NOTE — Procedures (Signed)
   [name], [name]                        ACCOUNT NO.:  [id number]   MEDICAL RECORD NO.:  [id number]                  PATIENT TYPE:   LOCATION:                                       FACILITY:   PHYSICIAN:  Donna Bernard, M.D.             DATE OF BIRTH:   DATE OF PROCEDURE:  08/30/2001  DATE OF DISCHARGE:                                EKG INTERPRETATION   EKG reveals first-degree AV block accompanied by left bundle branch block.  Biphasic T waves suggest left atrial enlargement.  ST-T changes are  consistent with left bundle branch block.                                               Donna Bernard, M.D.    Karie Chimera  D:  01/25/2002  T:  01/26/2002  Job:  [id number]

## 2010-07-01 NOTE — Assessment & Plan Note (Signed)
[facility]                         ELECTROPHYSIOLOGY OFFICE NOTE   NAME:Stehlin, AVABELLA WAILES                   MRN:          [id number]  DATE:03/29/2006                            DOB:          02/22/1923    Ms. Grantz returns today for followup.  She is a pleasant elderly  woman with a history of non-ischemic cardiomyopathy, VT, status post Bi-  V ICD insertion.  She also has complete heart block.  Her main complaint  is that of a dizzy spell which occurred several days ago.  It lasted for  a few seconds, did not result in frank syncope, not associated with  chest pain or shortness of breath.  She returns today for followup.  She  denies any intercurrent _________ therapies.  She otherwise is well.   On exam, she is a pleasant elderly woman in no acute distress.  The  blood pressure today was 120/80 by me, the pulse was 7 and regular, the  respirations were 18.  NECK:  8 cm jugular venous distention.  There was no thyromegaly.  LUNGS:  Clear bilaterally to auscultation.  There were no wheezes, rales  or rhonchi.  CARDIOVASCULAR:  Regular rate and rhythm with normal S1 and S2.  There  were no murmurs, rubs or gallops.  EXTREMITIES:  Demonstrate no cyanosis, clubbing or edema.   MEDICATIONS:  Medications include Lotensin, Furosemide, Lanoxin, Tricor,  Carbetalol 12.5 twice daily, and Lumigan eye drops.   Interrogation of her defibrillator demonstrates a Guidant Contact H175  with P-waves of 4.  There are no R-waves secondary to underlying  pacemaker dependence.  The impedence 511 in the atrium, 622 in the  ventricle.  Threshold was 2 at 0.8 in the atrium, 1.2 at 0.5 in the  right ventricle, 2.4 at 1 in the left ventricle.  The battery voltage  was 2.88 volts.   IMPRESSION:  1. Non-ischemic cardiomyopathy.  2. Complete heart block.  3. VT.  4. Status post ICD insertion.   DISCUSSION:  Overall, Ms. Castille is stable and her defibrillator is  working normally.  Her episodes of dizziness and near syncope are  unclear.  I recommended a period of watchful waiting.  I have also  recommended that she obtain a blood pressure machine and when she feels  her dizzy spells check her blood pressure to be sure that she is not  hypotensive.  I will plan to see her back in several months.    Doylene Canning. Ladona Ridgel, MD  Electronically Signed   GWT/MedQ  DD: 03/29/2006  DT: 03/29/2006  Job #: [id number]   cc:   Donna Bernard, M.D.

## 2010-07-01 NOTE — Procedures (Signed)
[place]. [facility]  Patient:    [name], [name] Visit Number: [id number] MRN: [id number]          Service Type: MED Location: [phone] Attending Physician:  Mirian Mo Dictated by:   Doylene Canning. Ladona Ridgel, M.D. Proc. Date: 09/06/01 Admit Date:  09/02/2001   CC:         Loran Senters, M.D.  Thomas C. Wall, M.D. [facility]  Kathrine Cords   Procedure Report  PROCEDURE PERFORMED:  Implantation of a dual chamber, biventricular ICD.  INDICATIONS:  Nonischemic cardiomyopathy with ejection fraction of 15%, left bundle branch block with inducible monomorphic VT in the setting of a patient with symptomatic nonsustained VT.  INTRODUCTION:  The patient is a very pleasant 77 year old woman with a long history of nonischemic cardiomyopathy and ejection fraction of 15%.  She did amazingly well for several years.  Over the last two months, she has had worsening shortness of breath and over the last several weeks, she has had spells of near syncope with light-headedness and dizziness.  Holter monitor demonstrated nonsustained ventricular tachycardia at a cycle length of approximately 350 msec.  She is subsequently admitted for additional evaluation.  The patient subsequently underwent ICD testing and had inducible monomorphic VT.  Because of the wide QRS worsening heart failure, severe LV dysfunction and inducible VT she is referred for biventricular dual chamber ICD implantation.  PROCEDURE:  Informed consent was obtained.  The patient was taken to the diagnostic catheterization lab in a fasting state.  After the usual preparation and draping, a total of 30 cc of lidocaine was infiltrated into the left infraclavicular region.  A 10 cm incision was carried out over this region and electrocautery was utilized to dissect down to the subpectoralis fascia.  Contrast 10 cc demonstrated a patent left subclavian vein.  It was subsequently punctured and the Guidant  model [id number] defibrillation lead and the Guidant model [id number] bipolar pacing leads were advanced into the right ventricle and right atrium respectively.  Mapping was carried out in the right ventricle at the final site, R-waves measured 16 mv with a pacing threshold of 0.5 msec.  With the RV lead in satisfactory position, attention was turned to the RA lead.  It was subsequently removed in the right atrial appendage and actively fixed.  Her P-waves measured approximately 2 mv.  The atrial threshold was 1.4 v and .5 msec once the lead was actively fixed.  Pacing 10 volt from the right atrium and the right ventricle demonstrated no diaphragmatic stimulation.  At this point, attention was turned to the LV lead.  The guiding catheter was subsequently advanced over the [place] hemostatic sheath into the region of the right atrium.  Utilizing a Josephson EP catheter, the coronary sinus was subsequently cannulated and the Guidant model [id number] LV pacing needle was advanced into the lateral vein of the coronary sinus.  It was advanced approximately one-half way out to the LV apex.  At dislocation, LV waves measured 14 mv with pacing impedance of 350 ohlms and pacing threshold of 1 v at 0.5 msec.  Pacing 10 v in the LV lead did not result in diaphragmatic stimulation.  At this point, both RV, LV and RA leads were secured to the subpectoralis fascia with the figure-of-eight silk suture.  In addition, the sewing sleeves were secured with silk suture.  Electrocautery was utilized to make a subcutaneous pocket.  Kanamycin irrigation was utilized to irrigate the pocket and electrocautery utilized to  maintain hemostasis.  Please note the defibrillation lead was serial [id number].  With the pocket formed, the LV, RV and RA leads were connected to the Guidant contact renewal dual chamber biventricular defibrillator model H135, serial [id number] and the generator was placed in the  subcutaneous pocket.  Silk suture utilized to secure the generator to the subpectoralis fascia.  At this point, the patient was more deeply sedated and defibrillation threshold testing carried out.  After the patient was deeply sedated, the initial DFT test was carried out with 14 dual sock. VF was induced with a T-wave shock and 14 joules terminated ventricular fibrillation restoring sinus rhythm.  Five minutes was allowed to elapse and a second DFT test was carried out.  Again VF was induced with a T-wave shock and a 14 joule shock terminated VF restoring sinus rhythm.  At this point, no additional DFT testing was carried out and the incision was closed with layer of 2-0 Vicryl followed by a layer of 3-0 Vicryl, followed by a layer of 4-0 Vicryl.  Benzoin was painted on the skin.  Steri-Strips were applied.  The pressure dressing placed and the patient returned to her room in satisfactory condition.  COMPLICATIONS:  There were no immediate complications.  RESULTS:  This demonstrates successful implantation of a Guidant biventricular dual chamber defibrillation in a patient with nonischemic cardiomyopathy, ejection fraction of 15%, left bundle branch block, nonsustained VT with inducible monomorphic VT at EP testing. Dictated by:   Doylene Canning. Ladona Ridgel, M.D. Attending Physician:  Mirian Mo DD:  09/06/01 TD:  09/09/01 Job: [id number]

## 2010-07-06 NOTE — Telephone Encounter (Signed)
Error/tg °

## 2010-07-07 ENCOUNTER — Encounter: Payer: BC Managed Care – PPO | Admitting: *Deleted

## 2010-07-08 ENCOUNTER — Encounter: Payer: Self-pay | Admitting: *Deleted

## 2010-07-19 ENCOUNTER — Telehealth: Payer: Self-pay | Admitting: Internal Medicine

## 2010-07-26 ENCOUNTER — Encounter: Payer: Self-pay | Admitting: Cardiology

## 2010-08-04 ENCOUNTER — Ambulatory Visit (INDEPENDENT_AMBULATORY_CARE_PROVIDER_SITE_OTHER): Payer: Medicare Other | Admitting: Cardiology

## 2010-08-04 ENCOUNTER — Encounter: Payer: Self-pay | Admitting: Cardiology

## 2010-08-04 DIAGNOSIS — I5022 Chronic systolic (congestive) heart failure: Secondary | ICD-10-CM

## 2010-08-04 DIAGNOSIS — I10 Essential (primary) hypertension: Secondary | ICD-10-CM

## 2010-08-04 DIAGNOSIS — Z09 Encounter for follow-up examination after completed treatment for conditions other than malignant neoplasm: Secondary | ICD-10-CM

## 2010-08-04 DIAGNOSIS — I455 Other specified heart block: Secondary | ICD-10-CM

## 2010-08-04 DIAGNOSIS — Z95810 Presence of automatic (implantable) cardiac defibrillator: Secondary | ICD-10-CM

## 2010-08-04 DIAGNOSIS — Z9229 Personal history of other drug therapy: Secondary | ICD-10-CM

## 2010-08-04 MED ORDER — SPIRONOLACTONE 25 MG PO TABS: 25.0000 mg | ORAL_TABLET | Freq: Every day | ORAL | Status: DC

## 2010-08-04 NOTE — Progress Notes (Signed)
HPI:  [name] is a delightful, but fraile 77 y/o patient of Dr. Juanito Doom, we are seeing for ongoing assessment and treatment of Nonischemic CM with EF of 25%, complete HB with VT, s/p CRT-D implant in July of 2003, hypertension and dyslipidemia.  She is here for 6 months follow-up. She has had a significant weight loss since October of 2011, where she was admitted for A/C systolic HF. She has since been on a no salt diet, she is weighing herself each day and recording it. She brings with her all of her weights.  There has been a steady decline since discharge.  She states she is just not hungry but does eat 3 meals a day.  She goes out with friends and plays canasta twice a week. She does her own house cleaning and grocery shopping.  She does complain of fatigue and some swelling in he lower extremities on occasion.  On last visit with Dr. Ladona Ridgel, she was advised to take lasix on a prn basis for weight gain with parameters laid out by Dr. Ladona Ridgel.  Otherwise she is without complaint.  No Known Allergies  Current Outpatient Prescriptions  Medication Sig Dispense Refill  . amiodarone (PACERONE) 200 MG tablet Take 200 mg by mouth every morning.        . benazepril (LOTENSIN) 10 MG tablet Take 10 mg by mouth daily.        . calcium-vitamin D (OSCAL WITH D) 500-200 MG-UNIT per tablet Take 1 tablet by mouth daily.        . carvedilol (COREG) 3.125 MG tablet Take 3.125 mg by mouth 2 (two) times daily.        . furosemide (LASIX) 40 MG tablet Take 40 mg by mouth as needed. Patient states Dr.Taylor told her to take lasix only as needed      . latanoprost (XALATAN) 0.005 % ophthalmic solution Place 1 drop into both eyes daily.        . Multiple Vitamins-Minerals (CENTRUM SILVER PO) Take 1 tablet by mouth daily.        Marland Kitchen spironolactone (ALDACTONE) 25 MG tablet Take 1 tablet (25 mg total) by mouth daily.  30 tablet  6    Past Medical History  Diagnosis Date  . Glaucoma   . CHF (congestive heart failure)    . HTN (hypertension)     Past Surgical History  Procedure Date  . Icd implantation     [employer]; remote - no    ZOX:WRUEAV of systems complete and found to be negative unless listed above PHYSICAL EXAM BP 145/74  Pulse 53  Ht 4\' 9"  (1.448 m)  Wt 133 lb (60.328 kg)  BMI 28.78 kg/m2  SpO2 94% General: Well developed, well nourished, in no acute distress Head: Eyes PERRLA, No xanthomas.   Normal cephalic and atramatic  Lungs: Clear bilaterally to auscultation and percussion. Heart: HRRR S1 S2 distant heart sounds.  Pulses are 2+ & equal.            No carotid bruit. No JVD.  No abdominal bruits. No femoral bruits. Abdomen: Bowel sounds are positive, abdomen soft and non-tender without masses or                  Hernia's noted. Msk:  Back normal, normal gait. Diminished  strength and tone for age. Tremors are prominent Extremities: No clubbing, cyanosis or  1+edema in Left lower extremity..  DP +1 Neuro: Alert and oriented X 3. Psych:  Good affect, responds appropriately   ASSESSMENT AND PLAN

## 2010-08-04 NOTE — Patient Instructions (Addendum)
**Note De-Identified Asah Lamay Obfuscation** Your physician recommends that you return for lab work in: a week to 10 days  Your physician has recommended you make the following change in your medication: start taking Spironolactone 25mg  daily  Your physician recommends that you schedule a follow-up appointment in: 6 months

## 2010-08-04 NOTE — Assessment & Plan Note (Signed)
Patient will be placed on spironolactone 25mg  daily as her BP is not optimal for patient with EF of 25%. This has been discussed with Dr. Daleen Squibb. We will repeat CBC, BMET and TSH as she is on amiodarone.  Will see her on follow-up with Dr. Daleen Squibb.  If there is significant abnormalities will have her seen sooner.  She is referred back to her PCP for issues with weight loss.

## 2010-08-04 NOTE — Assessment & Plan Note (Signed)
She is to see Dr. Ladona Ridgel in Sept of 2012 for face to face follow-up.

## 2010-08-12 ENCOUNTER — Other Ambulatory Visit: Payer: Self-pay | Admitting: Cardiology

## 2010-08-13 LAB — COMPREHENSIVE METABOLIC PANEL
AST: 16 U/L (ref 0–37)
Albumin: 4.1 g/dL (ref 3.5–5.2)
BUN: 21 mg/dL (ref 6–23)
CO2: 28 mEq/L (ref 19–32)
Calcium: 9.9 mg/dL (ref 8.4–10.5)
Chloride: 102 mEq/L (ref 96–112)
Glucose, Bld: 87 mg/dL (ref 70–99)
Potassium: 5.1 mEq/L (ref 3.5–5.3)

## 2010-08-13 LAB — LIPID PANEL
Cholesterol: 236 mg/dL — ABNORMAL HIGH (ref 0–200)
HDL: 53 mg/dL (ref >39)
Triglycerides: 119 mg/dL (ref <150)

## 2010-08-13 LAB — CBC WITH DIFFERENTIAL/PLATELET
HCT: 41.5 % (ref 36.0–46.0)
Hemoglobin: 13 g/dL (ref 12.0–15.0)
Lymphocytes Relative: 33 % (ref 12–46)
Lymphs Abs: 2.2 10*3/uL (ref 0.7–4.0)
MCV: 91.2 fL (ref 78.0–100.0)
Monocytes Absolute: 0.6 10*3/uL (ref 0.1–1.0)
Monocytes Relative: 9 % (ref 3–12)
Neutro Abs: 3.5 10*3/uL (ref 1.7–7.7)
WBC: 6.7 10*3/uL (ref 4.0–10.5)

## 2010-08-13 LAB — TSH: TSH: 4.527 u[IU]/mL — ABNORMAL HIGH (ref 0.350–4.500)

## 2010-08-15 ENCOUNTER — Other Ambulatory Visit: Payer: Self-pay

## 2010-08-15 DIAGNOSIS — E785 Hyperlipidemia, unspecified: Secondary | ICD-10-CM

## 2010-08-16 ENCOUNTER — Other Ambulatory Visit: Payer: Self-pay | Admitting: Internal Medicine

## 2010-08-16 NOTE — Telephone Encounter (Signed)
Pt called to see if office was going to send in the new  Script to [facility] for cholesterol. Pt would like to be called at home.

## 2010-08-17 MED ORDER — ATORVASTATIN CALCIUM 20 MG PO TABS: 20.0000 mg | ORAL_TABLET | Freq: Every day | ORAL | Status: DC

## 2010-08-17 NOTE — Telephone Encounter (Signed)
Addended by: Augusto Gamble on: 08/17/2010 01:16 PM   Modules accepted: Orders

## 2010-08-17 NOTE — Telephone Encounter (Signed)
Ordered atorvastatin and labs for pt

## 2010-08-18 ENCOUNTER — Telehealth: Payer: Self-pay | Admitting: Cardiology

## 2010-08-18 NOTE — Telephone Encounter (Signed)
Patient states that she does not want to take Lipitor / tg

## 2010-08-18 NOTE — Telephone Encounter (Signed)
Pt is upset with cost being $20, stated that we have samples in the office for her use and will try to speak with the doc about changing medication if that is what she wants [place] to do.  She stated she has paid for it now and will try it.

## 2010-09-16 ENCOUNTER — Other Ambulatory Visit: Payer: Self-pay | Admitting: Cardiology

## 2010-09-17 LAB — LIPID PANEL
HDL: 51 mg/dL (ref >39)
Total CHOL/HDL Ratio: 3.3 Ratio

## 2010-09-17 LAB — T4, FREE: Free T4: 1.78 ng/dL (ref 0.80–1.80)

## 2010-09-17 LAB — HEPATIC FUNCTION PANEL
AST: 18 U/L (ref 0–37)
Albumin: 4.1 g/dL (ref 3.5–5.2)
Total Bilirubin: 0.4 mg/dL (ref 0.3–1.2)

## 2010-09-23 ENCOUNTER — Telehealth: Payer: Self-pay | Admitting: Cardiology

## 2010-09-23 ENCOUNTER — Telehealth: Payer: Self-pay | Admitting: *Deleted

## 2010-09-23 NOTE — Telephone Encounter (Signed)
Pt is aware of lab results. She would like to have her future labs drawn at [facility]. Mylo Red RN

## 2010-09-23 NOTE — Telephone Encounter (Signed)
Patient would like print out of lab work done on 8/3 / tg

## 2010-10-04 ENCOUNTER — Telehealth: Payer: Self-pay | Admitting: Cardiology

## 2010-10-04 ENCOUNTER — Other Ambulatory Visit: Payer: Self-pay | Admitting: Internal Medicine

## 2010-10-04 MED ORDER — AMIODARONE HCL 200 MG PO TABS: 200.0000 mg | ORAL_TABLET | ORAL | Status: DC

## 2010-10-04 NOTE — Telephone Encounter (Signed)
PT NEEDS PACERONE 200MG  CALLED IN TO [facility] IN [place].

## 2010-10-14 ENCOUNTER — Encounter: Payer: Self-pay | Admitting: Internal Medicine

## 2010-10-18 ENCOUNTER — Ambulatory Visit (INDEPENDENT_AMBULATORY_CARE_PROVIDER_SITE_OTHER): Payer: Medicare Other | Admitting: Internal Medicine

## 2010-10-18 ENCOUNTER — Encounter: Payer: Self-pay | Admitting: Internal Medicine

## 2010-10-18 DIAGNOSIS — I5022 Chronic systolic (congestive) heart failure: Secondary | ICD-10-CM

## 2010-10-18 DIAGNOSIS — I472 Ventricular tachycardia: Secondary | ICD-10-CM

## 2010-10-18 DIAGNOSIS — Z95810 Presence of automatic (implantable) cardiac defibrillator: Secondary | ICD-10-CM

## 2010-10-18 DIAGNOSIS — I442 Atrioventricular block, complete: Secondary | ICD-10-CM

## 2010-10-18 LAB — ICD DEVICE OBSERVATION
ATRIAL PACING ICD: 16 pct
BAMS-0002: 8 ms
BAMS-0003: 70 {beats}/min
CHARGE TIME: 8.805 s
HV IMPEDENCE: 52 Ohm
RV LEAD IMPEDENCE ICD: 463 Ohm
TOT-0001: 7
TOT-0002: 8
TZAT-0001FASTVT: 2
TZAT-0012FASTVT: 200 ms
TZAT-0013FASTVT: 2
TZAT-0013FASTVT: 2
TZAT-0018FASTVT: NEGATIVE
TZAT-0018FASTVT: NEGATIVE
TZAT-0019FASTVT: 5 V
TZAT-0019FASTVT: 5 V
TZAT-0020FASTVT: 1 ms
TZAT-0020FASTVT: 1 ms
TZON-0003FASTVT: 375 ms
TZON-0004FASTVT: 2.5
TZON-0005FASTVT: 1
TZST-0001FASTVT: 3
TZST-0001FASTVT: 5
TZST-0001FASTVT: 6
TZST-0001FASTVT: 7
TZST-0003FASTVT: 41 J
TZST-0003FASTVT: 41 J

## 2010-10-18 MED ORDER — AMIODARONE HCL 200 MG PO TABS: ORAL_TABLET | ORAL | Status: DC

## 2010-10-18 NOTE — Patient Instructions (Addendum)
**Note De-Identified [name] Obfuscation** Your physician has recommended you make the following change in your medication: decrease Amiodarone to 1 tablet Monday through Friday  and 1/2 tablet on Saturday and Sunday  Your physician recommends that you schedule a follow-up appointment in: 6 months

## 2010-10-19 ENCOUNTER — Encounter: Payer: Self-pay | Admitting: Internal Medicine

## 2010-10-19 NOTE — Assessment & Plan Note (Signed)
She has had no symptomatic ventricular arrhythmias. She will continue amiodarone therapy. Because she has been symptom and arrhythmia free, I have asked her to reduce her amiodarone to 200 mg daily, Monday through Friday and 100 mg dailey, on Saturday and Sunday

## 2010-10-19 NOTE — Assessment & Plan Note (Signed)
Her device is working normally. We'll plan to recheck in several months. 

## 2010-10-19 NOTE — Progress Notes (Signed)
HPI Mrs. Pauls returns today for followup. She is a very pleasant 77 year old woman with a nonischemic cardiomyopathy, chronic systolic heart failure, in particular tachycardia, status post ICD implantation. She was hospitalized nearly a year ago with congestive heart failure and multiple ICD shocks. Since then she has done fairly well. She is maintaining sinus rhythm on amiodarone therapy with no significant ventricular arrhythmias. Her congestive heart failure remains class II. The patient continues to be active despite her advanced age. She denies syncope. No Known Allergies   Current Outpatient Prescriptions  Medication Sig Dispense Refill  . amiodarone (PACERONE) 200 MG tablet Decrease in dose/Take 1 tablet Monday through Friday and 1/2 tablet on Saturday and Sunday  30 tablet  6  . atorvastatin (LIPITOR) 20 MG tablet Take 1 tablet (20 mg total) by mouth daily.  30 tablet  3  . benazepril (LOTENSIN) 10 MG tablet Take 10 mg by mouth daily.        . calcium-vitamin D (OSCAL WITH D) 500-200 MG-UNIT per tablet Take 1 tablet by mouth daily.        . carvedilol (COREG) 3.125 MG tablet Take 3.125 mg by mouth 2 (two) times daily.        . furosemide (LASIX) 40 MG tablet Take 40 mg by mouth as needed. Patient states Dr.Maudry Zeidan told her to take lasix only as needed      . latanoprost (XALATAN) 0.005 % ophthalmic solution Place 1 drop into both eyes daily.        . Multiple Vitamins-Minerals (CENTRUM SILVER PO) Take 1 tablet by mouth daily.        Marland Kitchen spironolactone (ALDACTONE) 25 MG tablet Take 1 tablet (25 mg total) by mouth daily.  30 tablet  6     Past Medical History  Diagnosis Date  . Glaucoma   . CHF (congestive heart failure)   . HTN (hypertension)     ROS:   All systems reviewed and negative except as noted in the HPI.   Past Surgical History  Procedure Date  . Icd implantation     [employer]; remote - no     Family History  Problem Relation Age of Onset  . Coronary  artery disease      family history; female <65     History   Social History  . Marital Status: Widowed    Spouse Name: N/A    Number of Children: N/A  . Years of Education: N/A   Occupational History  . Not on file.   Social History Main Topics  . Smoking status: Never Smoker   . Smokeless tobacco: Never Used   Comment: tobacco - use  . Alcohol Use: No  . Drug Use: No  . Sexually Active: Not on file   Other Topics Concern  . Not on file   Social History Narrative   Widowed, retired, does not get regular exercise.      BP 119/63  Pulse 63  Resp 20  Ht 5\' 3"  (1.6 m)  Wt 124 lb 6.4 oz (56.427 kg)  BMI 22.04 kg/m2  SpO2 93%  Physical Exam:  Well appearing elderly woman NAD HEENT: Unremarkable Neck:  No JVD, no thyromegally Lymphatics:  No adenopathy Back:  No CVA tenderness Lungs:  Clear with no wheezes, rales, or rhonchi. HEART:  Regular rate rhythm, no murmurs, no rubs, no clicks Abd:  soft, positive bowel sounds, no organomegally, no rebound, no guarding Ext:  2 plus pulses, no edema, no cyanosis, no  clubbing Skin:  No rashes no nodules. Well-healed ICD incision Neuro:  CN II through XII intact, motor grossly intact  DEVICE  Normal device function.  See PaceArt for details.   Assess/Plan:

## 2010-10-19 NOTE — Assessment & Plan Note (Signed)
Her symptoms remain class II. She will continue her current medical therapy and maintain a low-sodium diet. 

## 2010-11-23 ENCOUNTER — Encounter: Payer: Self-pay | Admitting: Cardiology

## 2010-11-23 ENCOUNTER — Telehealth: Payer: Self-pay | Admitting: *Deleted

## 2010-11-23 ENCOUNTER — Telehealth: Payer: Self-pay | Admitting: Cardiology

## 2010-11-23 DIAGNOSIS — E785 Hyperlipidemia, unspecified: Secondary | ICD-10-CM | POA: Insufficient documentation

## 2010-11-23 MED ORDER — SIMVASTATIN 40 MG PO TABS: 40.0000 mg | ORAL_TABLET | Freq: Every day | ORAL | Status: DC

## 2010-11-23 NOTE — Telephone Encounter (Signed)
Spoke to patient and made her aware that I have routed her request for medication change to Dr Dietrich Pates and will get back with her as soon as I receive a response.

## 2010-11-23 NOTE — Telephone Encounter (Addendum)
Per Dr Dietrich Pates, Lipitor changed to Simvastatin 40mg  daily.  Pt notified.  Recent lipid profile shows total cholesterol of 168, HDL 51 and LDL of 91 representing good control for a patient without known significant vascular disease.  Atorvastatin can be discontinued after patient completes her current prescription and simvastatin 40 mg per day substituted.  Lipid profile can be reassessed when other lab tests are next required.

## 2010-11-23 NOTE — Telephone Encounter (Signed)
Patient wants to know if her cholesterol medicine can be changed from Atorvastatin to a cheaper one. She states that it is a Tier 2 on her insurance and she needs a Tier 1.  States that she has taken Simvastatin before and it is a Tier 1. / tg

## 2010-12-09 ENCOUNTER — Other Ambulatory Visit: Payer: Self-pay | Admitting: Internal Medicine

## 2010-12-09 NOTE — Telephone Encounter (Signed)
Pt wants refill carvediolo called [facility] in [place] 180 tablets

## 2010-12-20 ENCOUNTER — Other Ambulatory Visit: Payer: Self-pay | Admitting: Internal Medicine

## 2010-12-20 MED ORDER — BENAZEPRIL HCL 10 MG PO TABS: 10.0000 mg | ORAL_TABLET | Freq: Every day | ORAL | Status: DC

## 2011-01-19 ENCOUNTER — Other Ambulatory Visit: Payer: Self-pay | Admitting: Internal Medicine

## 2011-01-19 ENCOUNTER — Encounter: Payer: Self-pay | Admitting: Internal Medicine

## 2011-01-19 ENCOUNTER — Ambulatory Visit (INDEPENDENT_AMBULATORY_CARE_PROVIDER_SITE_OTHER): Payer: Medicare Other | Admitting: *Deleted

## 2011-01-19 DIAGNOSIS — Z95810 Presence of automatic (implantable) cardiac defibrillator: Secondary | ICD-10-CM

## 2011-01-19 DIAGNOSIS — I442 Atrioventricular block, complete: Secondary | ICD-10-CM

## 2011-01-19 DIAGNOSIS — I5022 Chronic systolic (congestive) heart failure: Secondary | ICD-10-CM

## 2011-01-25 LAB — REMOTE ICD DEVICE
CHARGE TIME: 8.9 s
LV LEAD IMPEDENCE ICD: 557 Ohm
RV LEAD IMPEDENCE ICD: 476 Ohm
TOT-0006: 20121122000000
TZAT-0001FASTVT: 1
TZAT-0018FASTVT: NEGATIVE
TZST-0001FASTVT: 3
TZST-0001FASTVT: 8
TZST-0003FASTVT: 41 J
TZST-0003FASTVT: 41 J

## 2011-01-31 NOTE — Progress Notes (Signed)
Remote icd check  

## 2011-03-15 ENCOUNTER — Other Ambulatory Visit (HOSPITAL_COMMUNITY): Payer: Self-pay | Admitting: Orthopaedic Surgery

## 2011-03-15 ENCOUNTER — Other Ambulatory Visit (HOSPITAL_COMMUNITY): Payer: Self-pay | Admitting: *Deleted

## 2011-03-15 DIAGNOSIS — M1990 Unspecified osteoarthritis, unspecified site: Secondary | ICD-10-CM

## 2011-03-15 DIAGNOSIS — M25559 Pain in unspecified hip: Secondary | ICD-10-CM

## 2011-03-16 ENCOUNTER — Other Ambulatory Visit (HOSPITAL_COMMUNITY): Payer: Medicare Other

## 2011-03-17 ENCOUNTER — Ambulatory Visit (HOSPITAL_COMMUNITY)
Admission: RE | Admit: 2011-03-17 | Discharge: 2011-03-17 | Disposition: A | Discharge status: Home / Self Care | Payer: Medicare Other | Source: Ambulatory Visit | Attending: Orthopaedic Surgery | Admitting: Orthopaedic Surgery

## 2011-03-17 ENCOUNTER — Other Ambulatory Visit (HOSPITAL_COMMUNITY): Payer: Self-pay | Admitting: Orthopaedic Surgery

## 2011-03-17 DIAGNOSIS — M1990 Unspecified osteoarthritis, unspecified site: Secondary | ICD-10-CM

## 2011-03-17 DIAGNOSIS — M25559 Pain in unspecified hip: Secondary | ICD-10-CM

## 2011-03-17 MED ORDER — IOHEXOL 300 MG/ML  SOLN
5.0000 mL | Freq: Once | INTRAMUSCULAR | Status: AC | PRN
  Administered 2011-03-17: 5 mL via INTRA_ARTICULAR

## 2011-03-17 NOTE — Progress Notes (Signed)
80mg  Depomedrol and 4mL Lidocaine 1% injected into left hip joint space    Tolerated well by patient

## 2011-03-22 ENCOUNTER — Other Ambulatory Visit: Payer: Self-pay | Admitting: Cardiology

## 2011-03-22 MED ORDER — SPIRONOLACTONE 25 MG PO TABS: 25.0000 mg | ORAL_TABLET | Freq: Every day | ORAL | Status: DC

## 2011-03-22 NOTE — Telephone Encounter (Signed)
PER PT CALL NEEDS RX REFILL TO [facility] IN [place]

## 2011-03-24 ENCOUNTER — Other Ambulatory Visit: Payer: Self-pay | Admitting: Internal Medicine

## 2011-03-24 DIAGNOSIS — I472 Ventricular tachycardia: Secondary | ICD-10-CM

## 2011-03-24 MED ORDER — AMIODARONE HCL 200 MG PO TABS: ORAL_TABLET | ORAL | Status: DC

## 2011-03-24 NOTE — Telephone Encounter (Signed)
New msg: pt said MD needs to call insurance for pre-auth of medication.

## 2011-03-27 MED FILL — Methylprednisolone Acetate Inj Susp 80 MG/ML: INTRAMUSCULAR | Qty: 1 | Status: AC

## 2011-03-27 MED FILL — Lidocaine HCl Local Inj 2%: INTRAMUSCULAR | Qty: 2 | Status: AC

## 2011-04-03 ENCOUNTER — Telehealth: Payer: Self-pay | Admitting: Internal Medicine

## 2011-04-03 NOTE — Telephone Encounter (Signed)
New Msg: Pt calling wanting to speak with nurse/MD regarding pt trana-dol (for pt hip--generic for tramadol). Pt calling wanting to speak with MD regarding this medication as it is causing pt to feel "swimming/dizzy/light headed" and feel like pt chest is tightening.   Please return pt call to discuss further.

## 2011-04-03 NOTE — Telephone Encounter (Signed)
Spoke with patient  She started a new medication for tremors last night(we did not prescribe) It is called Primidone 50mg .  This morning she woke up and was dizzy/lightheaded.  Wondering if it is coming from new medication.  With her history of VT I have asked that she send a remote transmission for review.  We are trying to walk her through the steps.

## 2011-04-03 NOTE — Telephone Encounter (Signed)
The interrogation did not come through  [place] called patient and asked her to send again.  She is going to follow up with her tomorrow

## 2011-04-03 NOTE — Telephone Encounter (Signed)
Pt sent transmission---no episodes recorded on latitude transmission.

## 2011-04-06 ENCOUNTER — Other Ambulatory Visit: Payer: Self-pay

## 2011-04-06 ENCOUNTER — Other Ambulatory Visit: Payer: Self-pay | Admitting: Internal Medicine

## 2011-04-06 MED ORDER — CARVEDILOL 3.125 MG PO TABS: 3.1250 mg | ORAL_TABLET | Freq: Two times a day (BID) | ORAL | Status: DC

## 2011-04-18 ENCOUNTER — Ambulatory Visit (INDEPENDENT_AMBULATORY_CARE_PROVIDER_SITE_OTHER): Payer: Medicare Other | Admitting: Internal Medicine

## 2011-04-18 ENCOUNTER — Encounter: Payer: Self-pay | Admitting: Internal Medicine

## 2011-04-18 DIAGNOSIS — I5022 Chronic systolic (congestive) heart failure: Secondary | ICD-10-CM

## 2011-04-18 DIAGNOSIS — E785 Hyperlipidemia, unspecified: Secondary | ICD-10-CM

## 2011-04-18 DIAGNOSIS — I472 Ventricular tachycardia: Secondary | ICD-10-CM

## 2011-04-18 DIAGNOSIS — I442 Atrioventricular block, complete: Secondary | ICD-10-CM

## 2011-04-18 LAB — ICD DEVICE OBSERVATION
AL AMPLITUDE: 2.5 mv
AL IMPEDENCE ICD: 470 Ohm
AL THRESHOLD: 1.1 V
LV LEAD THRESHOLD: 2.2 V
RV LEAD THRESHOLD: 1.3 V
TZAT-0001FASTVT: 2
TZAT-0018FASTVT: NEGATIVE
TZON-0003FASTVT: 375 ms
TZST-0001FASTVT: 5
TZST-0001FASTVT: 7
TZST-0001FASTVT: 8
TZST-0003FASTVT: 41 J
TZST-0003FASTVT: 41 J
TZST-0003FASTVT: 41 J

## 2011-04-18 MED ORDER — ATENOLOL 50 MG PO TABS: 50.0000 mg | ORAL_TABLET | Freq: Every day | ORAL | Status: DC

## 2011-04-18 NOTE — Patient Instructions (Addendum)
**Note De-Identified Avon Molock Obfuscation** Your physician recommends that you schedule a follow-up appointment in: 1 year with Dr Taylor/Reschedule Dr Daleen Squibb appt for 4 months  Two wheeled walker  Your physician has recommended you make the following change in your medication: STOP Simvastatin

## 2011-04-19 ENCOUNTER — Encounter: Payer: Self-pay | Admitting: Internal Medicine

## 2011-04-19 NOTE — Assessment & Plan Note (Signed)
Her symptoms remain class 2. Will follow.

## 2011-04-19 NOTE — Progress Notes (Signed)
HPI [name] returns today for followup. She is a pleasant 78 yo woman with a h/o DCM, VT, chronic systolic heart failure and s/p BiV ICD. She denies c/p. She has chronic systolic heart failure. She has had no recent ICD shock on low dose amiodarone.  No Known Allergies   Current Outpatient Prescriptions  Medication Sig Dispense Refill  . benazepril (LOTENSIN) 10 MG tablet Take 1 tablet (10 mg total) by mouth daily.  30 tablet  5  . calcium-vitamin D (OSCAL WITH D) 500-200 MG-UNIT per tablet Take 1 tablet by mouth daily.        . carvedilol (COREG) 3.125 MG tablet Take 1 tablet (3.125 mg total) by mouth 2 (two) times daily with a meal.  60 tablet  2  . furosemide (LASIX) 40 MG tablet Take 40 mg by mouth as needed. Patient states Dr.Sharde Gover told her to take lasix only as needed      . latanoprost (XALATAN) 0.005 % ophthalmic solution Place 1 drop into both eyes daily.        . Multiple Vitamins-Minerals (CENTRUM SILVER PO) Take 1 tablet by mouth daily.        . primidone (MYSOLINE) 50 MG tablet       . spironolactone (ALDACTONE) 25 MG tablet Take 1 tablet (25 mg total) by mouth daily.  30 tablet  1  . traMADol (ULTRAM) 50 MG tablet       . amiodarone (PACERONE) 200 MG tablet Decrease in dose/Take 1 tablet Monday through Friday and 1/2 tablet on Saturday and Sunday  30 tablet  0     Past Medical History  Diagnosis Date  . Glaucoma   . CHF (congestive heart failure)   . HTN (hypertension)     ROS:   All systems reviewed and negative except as noted in the HPI.   Past Surgical History  Procedure Date  . Icd implantation     [employer]; remote - no     Family History  Problem Relation Age of Onset  . Coronary artery disease      family history; female <65     History   Social History  . Marital Status: Widowed    Spouse Name: N/A    Number of Children: N/A  . Years of Education: N/A   Occupational History  . Not on file.   Social History Main Topics  .  Smoking status: Never Smoker   . Smokeless tobacco: Never Used   Comment: tobacco - use  . Alcohol Use: No  . Drug Use: No  . Sexually Active: Not on file   Other Topics Concern  . Not on file   Social History Narrative   Widowed, retired, does not get regular exercise.      BP 120/67  Pulse 50  Ht 5' (1.524 m)  Wt 54.432 kg (120 lb)  BMI 23.44 kg/m2  Physical Exam:  Well appearing elderly woman, NAD HEENT: Unremarkable Neck:  No JVD, no thyromegally Back:  No CVA tenderness. Severe Kyphosis. Lungs:  Clear with no wheezes or rales. HEART:  Regular rate rhythm, no murmurs, no rubs, no clicks Abd:  soft, positive bowel sounds, no organomegally, no rebound, no guarding Ext:  2 plus pulses, no edema, no cyanosis, no clubbing Skin:  No rashes no nodules Neuro:  CN II through XII intact, motor grossly intact DEVICE  Normal device function.  See PaceArt for details.   Assess/Plan:

## 2011-04-19 NOTE — Assessment & Plan Note (Signed)
I have asked her to stop simvastatin out of concerns for possible drug interaction with amiodarone. She has been losing weight and I think the risk/benefit of additional statin therapy in favor of no statins.

## 2011-04-19 NOTE — Assessment & Plan Note (Signed)
Her VT is well controlled. I have asked her to continue her current medical regimen.

## 2011-04-21 ENCOUNTER — Ambulatory Visit: Payer: Medicare Other | Admitting: Cardiology

## 2011-04-21 ENCOUNTER — Other Ambulatory Visit: Payer: Self-pay

## 2011-04-21 ENCOUNTER — Other Ambulatory Visit: Payer: Self-pay | Admitting: Internal Medicine

## 2011-04-21 DIAGNOSIS — I472 Ventricular tachycardia: Secondary | ICD-10-CM

## 2011-04-21 MED ORDER — AMIODARONE HCL 200 MG PO TABS: ORAL_TABLET | ORAL | Status: DC

## 2011-04-24 ENCOUNTER — Other Ambulatory Visit: Payer: Self-pay

## 2011-04-24 DIAGNOSIS — I472 Ventricular tachycardia: Secondary | ICD-10-CM

## 2011-05-01 ENCOUNTER — Telehealth: Payer: Self-pay | Admitting: Cardiology

## 2011-05-01 NOTE — Telephone Encounter (Signed)
05/01/11--called and LM on identified v.m. That OK for mom to take primidone for tremors--do not see any contraindications to taking this med nor do i see any bad reactions for cardiac patients except if taking folic acid , which she is not--advised to go ahead and give to mom--nt

## 2011-05-01 NOTE — Telephone Encounter (Signed)
New Msg: Pt daughter calling to report that pt is taking new medication for hand tremors, primidone 50 mg. Pt daughter wanted to inform nurse/MD that pt is taking this medication to see if there were any negative drug interactions that should be aware of. Please return pt call to discuss further.

## 2011-06-05 ENCOUNTER — Other Ambulatory Visit: Payer: Self-pay | Admitting: *Deleted

## 2011-06-05 MED ORDER — CARVEDILOL 3.125 MG PO TABS: 3.1250 mg | ORAL_TABLET | Freq: Two times a day (BID) | ORAL | Status: DC

## 2011-06-07 ENCOUNTER — Encounter (HOSPITAL_COMMUNITY): Payer: Self-pay | Admitting: Pharmacy Technician

## 2011-06-07 NOTE — Patient Instructions (Signed)
22 [name]  06/07/2011   Your procedure is scheduled on:  5.2.13  Report to [facility] at 0800 AM.  Call this number if you have problems the morning of surgery: [phone]   Remember:   Do not eat food:After Midnight.  May have clear liquids:until Midnight .  Clear liquids include soda, tea, black coffee, apple or grape juice, broth.  Take these medicines the morning of surgery with A SIP OF WATER: coreg, amiodarone, benazepril, mysoline   Do not wear jewelry, make-up or nail polish.  Do not wear lotions, powders, or perfumes. You may wear deodorant.  Do not shave 48 hours prior to surgery.  Do not bring valuables to the hospital.  Contacts, dentures or bridgework may not be worn into surgery.  Leave suitcase in the car. After surgery it may be brought to your room.  For patients admitted to the hospital, checkout time is 11:00 AM the day of discharge.   Patients discharged the day of surgery will not be allowed to drive home.  Name and phone number of your driver: family  Special Instructions: N/A   Please read over the following fact sheets that you were given: Pain Booklet, Surgical Site Infection Prevention, Anesthesia Post-op Instructions and Care and Recovery After Surgery   PATIENT INSTRUCTIONS POST-ANESTHESIA  IMMEDIATELY FOLLOWING SURGERY:  Do not drive or operate machinery for the first twenty four hours after surgery.  Do not make any important decisions for twenty four hours after surgery or while taking narcotic pain medications or sedatives.  If you develop intractable nausea and vomiting or a severe headache please notify your doctor immediately.  FOLLOW-UP:  Please make an appointment with your surgeon as instructed. You do not need to follow up with anesthesia unless specifically instructed to do so.  WOUND CARE INSTRUCTIONS (if applicable):  Keep a dry clean dressing on the anesthesia/puncture wound site if there is drainage.  Once the wound has quit  draining you may leave it open to air.  Generally you should leave the bandage intact for twenty four hours unless there is drainage.  If the epidural site drains for more than 36-48 hours please call the anesthesia department.  QUESTIONS?:  Please feel free to call your physician or the hospital operator if you have any questions, and they will be happy to assist you.     [facility] Anesthesia Department [address] [place] [phone]    Cataract A cataract is a clouding of the lens of the eye. When a lens becomes cloudy, vision is reduced based on the degree and nature of the clouding. Many cataracts reduce vision to some degree. Some cataracts make people more near-sighted as they develop. Other cataracts increase glare. Cataracts that are ignored and become worse can sometimes look white. The white color can be seen through the pupil. CAUSES   Aging. However, cataracts may occur at any age, even in newborns.   Certain drugs.   Trauma to the eye.   Certain diseases such as diabetes.   Specific eye diseases such as chronic inflammation inside the eye or a sudden attack of a rare form of glaucoma.   Inherited or acquired medical problems.  SYMPTOMS   Gradual, progressive drop in vision in the affected eye.   Severe, rapid visual loss. This most often happens when trauma is the cause.  DIAGNOSIS  To detect a cataract, an eye doctor examines the lens. Cataracts are best diagnosed with an exam of  the eyes with the pupils enlarged (dilated) by drops.  TREATMENT  For an early cataract, vision may improve by using different eyeglasses or stronger lighting. If that does not help your vision, surgery is the only effective treatment. A cataract needs to be surgically removed when vision loss interferes with your everyday activities, such as driving, reading, or watching TV. A cataract may also have to be removed if it prevents examination or treatment of another eye  problem. Surgery removes the cloudy lens and usually replaces it with a substitute lens (intraocular lens, IOL).  At a time when both you and your doctor agree, the cataract will be surgically removed. If you have cataracts in both eyes, only one is usually removed at a time. This allows the operated eye to heal and be out of danger from any possible problems after surgery (such as infection or poor wound healing). In rare cases, a cataract may be doing damage to your eye. In these cases, your caregiver may advise surgical removal right away. The vast majority of people who have cataract surgery have better vision afterward. HOME CARE INSTRUCTIONS  If you are not planning surgery, you may be asked to do the following:  Use different eyeglasses.   Use stronger or brighter lighting.   Ask your eye doctor about reducing your medicine dose or changing medicines if it is thought that a medicine caused your cataract. Changing medicines does not make the cataract go away on its own.   Become familiar with your surroundings. Poor vision can lead to injury. Avoid bumping into things on the affected side. You are at a higher risk for tripping or falling.   Exercise extreme care when driving or operating machinery.   Wear sunglasses if you are sensitive to bright light or experiencing problems with glare.  SEEK IMMEDIATE MEDICAL CARE IF:   You have a worsening or sudden vision loss.   You notice redness, swelling, or increasing pain in the eye.   You have a fever.  Document Released: 01/30/2005 Document Revised: 01/19/2011 Document Reviewed: 09/23/2010 [facility] Patient Information 2012 [place], [place].

## 2011-06-08 ENCOUNTER — Encounter (HOSPITAL_COMMUNITY)
Admission: RE | Admit: 2011-06-08 | Discharge: 2011-06-08 | Disposition: A | Discharge status: Home / Self Care | Payer: Medicare Other | Source: Ambulatory Visit | Attending: Ophthalmology | Admitting: Ophthalmology

## 2011-06-08 ENCOUNTER — Encounter (HOSPITAL_COMMUNITY): Payer: Self-pay

## 2011-06-08 HISTORY — DX: Pain in left hip: M25.552

## 2011-06-08 HISTORY — DX: Unspecified osteoarthritis, unspecified site: M19.90

## 2011-06-08 HISTORY — DX: Tremor, unspecified: R25.1

## 2011-06-08 LAB — BASIC METABOLIC PANEL
BUN: 22 mg/dL (ref 6–23)
CO2: 30 mEq/L (ref 19–32)
Chloride: 99 mEq/L (ref 96–112)
Creatinine, Ser: 0.88 mg/dL (ref 0.50–1.10)
GFR calc Af Amer: 68 mL/min — ABNORMAL LOW (ref >90)
Potassium: 5.2 mEq/L — ABNORMAL HIGH (ref 3.5–5.1)

## 2011-06-14 MED ORDER — PHENYLEPHRINE HCL 2.5 % OP SOLN
OPHTHALMIC | Status: AC
  Administered 2011-06-15: 1 [drp] via OPHTHALMIC
  Filled 2011-06-14: qty 2

## 2011-06-14 MED ORDER — TETRACAINE HCL 0.5 % OP SOLN
OPHTHALMIC | Status: AC
  Administered 2011-06-15: 1 [drp] via OPHTHALMIC
  Filled 2011-06-14: qty 2

## 2011-06-14 MED ORDER — NEOMYCIN-POLYMYXIN-DEXAMETH 3.5-10000-0.1 OP OINT
TOPICAL_OINTMENT | OPHTHALMIC | Status: AC
  Filled 2011-06-14: qty 3.5

## 2011-06-14 MED ORDER — LIDOCAINE HCL 3.5 % OP GEL
OPHTHALMIC | Status: AC
  Administered 2011-06-15: 1 via OPHTHALMIC
  Filled 2011-06-14: qty 5

## 2011-06-14 MED ORDER — CYCLOPENTOLATE-PHENYLEPHRINE 0.2-1 % OP SOLN
OPHTHALMIC | Status: AC
  Administered 2011-06-15: 1 [drp] via OPHTHALMIC
  Filled 2011-06-14: qty 2

## 2011-06-14 MED ORDER — LIDOCAINE HCL (PF) 1 % IJ SOLN
INTRAMUSCULAR | Status: AC
  Filled 2011-06-14: qty 2

## 2011-06-15 ENCOUNTER — Ambulatory Visit (HOSPITAL_COMMUNITY): Payer: Medicare Other | Admitting: Anesthesiology

## 2011-06-15 ENCOUNTER — Encounter (HOSPITAL_COMMUNITY): Payer: Self-pay | Admitting: Anesthesiology

## 2011-06-15 ENCOUNTER — Ambulatory Visit (HOSPITAL_COMMUNITY)
Admission: RE | Admit: 2011-06-15 | Discharge: 2011-06-15 | Disposition: A | Discharge status: Home / Self Care | Payer: Medicare Other | Source: Ambulatory Visit | Attending: Ophthalmology | Admitting: Ophthalmology

## 2011-06-15 ENCOUNTER — Encounter (HOSPITAL_COMMUNITY): Payer: Self-pay | Admitting: *Deleted

## 2011-06-15 ENCOUNTER — Encounter (HOSPITAL_COMMUNITY)
Admission: RE | Disposition: A | Discharge status: Home / Self Care | Payer: Self-pay | Source: Ambulatory Visit | Attending: Ophthalmology

## 2011-06-15 DIAGNOSIS — I10 Essential (primary) hypertension: Secondary | ICD-10-CM | POA: Insufficient documentation

## 2011-06-15 DIAGNOSIS — Z79899 Other long term (current) drug therapy: Secondary | ICD-10-CM | POA: Insufficient documentation

## 2011-06-15 DIAGNOSIS — Z01812 Encounter for preprocedural laboratory examination: Secondary | ICD-10-CM | POA: Insufficient documentation

## 2011-06-15 DIAGNOSIS — Z95810 Presence of automatic (implantable) cardiac defibrillator: Secondary | ICD-10-CM | POA: Insufficient documentation

## 2011-06-15 DIAGNOSIS — H2510 Age-related nuclear cataract, unspecified eye: Secondary | ICD-10-CM | POA: Insufficient documentation

## 2011-06-15 DIAGNOSIS — Z01810 Encounter for preprocedural cardiovascular examination: Secondary | ICD-10-CM | POA: Insufficient documentation

## 2011-06-15 HISTORY — PX: CATARACT EXTRACTION W/PHACO: SHX586

## 2011-06-15 SURGERY — PHACOEMULSIFICATION, CATARACT, WITH IOL INSERTION
Anesthesia: Monitor Anesthesia Care | Site: Eye | Laterality: Right | Wound class: Clean

## 2011-06-15 MED ORDER — ACETAZOLAMIDE 250 MG PO TABS
250.0000 mg | ORAL_TABLET | Freq: Once | ORAL | Status: AC
  Administered 2011-06-15: 250 mg via ORAL

## 2011-06-15 MED ORDER — FENTANYL CITRATE 0.05 MG/ML IJ SOLN: 25.0000 ug | INTRAMUSCULAR | Status: DC | PRN

## 2011-06-15 MED ORDER — BSS IO SOLN
INTRAOCULAR | Status: DC | PRN
  Administered 2011-06-15: 15 mL via INTRAOCULAR

## 2011-06-15 MED ORDER — POVIDONE-IODINE 5 % OP SOLN
OPHTHALMIC | Status: DC | PRN
  Administered 2011-06-15: 1 via OPHTHALMIC

## 2011-06-15 MED ORDER — LACTATED RINGERS IV SOLN
INTRAVENOUS | Status: DC
  Administered 2011-06-15: 09:00:00 via INTRAVENOUS

## 2011-06-15 MED ORDER — CYCLOPENTOLATE-PHENYLEPHRINE 0.2-1 % OP SOLN
1.0000 [drp] | OPHTHALMIC | Status: AC
  Administered 2011-06-15 (×3): 1 [drp] via OPHTHALMIC

## 2011-06-15 MED ORDER — LIDOCAINE HCL (PF) 1 % IJ SOLN
INTRAMUSCULAR | Status: DC | PRN
  Administered 2011-06-15: .4 mL

## 2011-06-15 MED ORDER — ACETAZOLAMIDE 250 MG PO TABS
ORAL_TABLET | ORAL | Status: AC
  Administered 2011-06-15: 250 mg via ORAL
  Filled 2011-06-15: qty 1

## 2011-06-15 MED ORDER — MIDAZOLAM HCL 2 MG/2ML IJ SOLN
INTRAMUSCULAR | Status: AC
  Administered 2011-06-15: 2 mg via INTRAVENOUS
  Filled 2011-06-15: qty 2

## 2011-06-15 MED ORDER — TETRACAINE HCL 0.5 % OP SOLN
1.0000 [drp] | OPHTHALMIC | Status: AC
  Administered 2011-06-15 (×3): 1 [drp] via OPHTHALMIC

## 2011-06-15 MED ORDER — PROVISC 10 MG/ML IO SOLN
INTRAOCULAR | Status: DC | PRN
  Administered 2011-06-15: 8.5 mg via INTRAOCULAR

## 2011-06-15 MED ORDER — MIDAZOLAM HCL 2 MG/2ML IJ SOLN
1.0000 mg | INTRAMUSCULAR | Status: DC | PRN
  Administered 2011-06-15: 2 mg via INTRAVENOUS

## 2011-06-15 MED ORDER — EPINEPHRINE HCL 1 MG/ML IJ SOLN
INTRAOCULAR | Status: DC | PRN
  Administered 2011-06-15: 10:00:00

## 2011-06-15 MED ORDER — PHENYLEPHRINE HCL 2.5 % OP SOLN
1.0000 [drp] | OPHTHALMIC | Status: AC
  Administered 2011-06-15 (×3): 1 [drp] via OPHTHALMIC

## 2011-06-15 MED ORDER — NEOMYCIN-POLYMYXIN-DEXAMETH 0.1 % OP OINT
TOPICAL_OINTMENT | OPHTHALMIC | Status: DC | PRN
  Administered 2011-06-15: 1 via OPHTHALMIC

## 2011-06-15 MED ORDER — EPINEPHRINE HCL 1 MG/ML IJ SOLN
INTRAMUSCULAR | Status: AC
  Filled 2011-06-15: qty 1

## 2011-06-15 MED ORDER — LIDOCAINE HCL 3.5 % OP GEL
1.0000 "application " | Freq: Once | OPHTHALMIC | Status: AC
  Administered 2011-06-15: 1 via OPHTHALMIC

## 2011-06-15 MED ORDER — LIDOCAINE 3.5 % OP GEL OPTIME - NO CHARGE
OPHTHALMIC | Status: DC | PRN
  Administered 2011-06-15: 1 [drp] via OPHTHALMIC

## 2011-06-15 MED ORDER — ONDANSETRON HCL 4 MG/2ML IJ SOLN: 4.0000 mg | Freq: Once | INTRAMUSCULAR | Status: DC | PRN

## 2011-06-15 SURGICAL SUPPLY — 32 items

## 2011-06-15 NOTE — Anesthesia Postprocedure Evaluation (Signed)
  Anesthesia Post-op Note  Patient: [name]  Procedure(s) Performed: Procedure(s) (LRB): CATARACT EXTRACTION PHACO AND INTRAOCULAR LENS PLACEMENT (IOC) (Right)  Patient Location:  Short Stay  Anesthesia Type: MAC  Level of Consciousness: awake  Airway and Oxygen Therapy: Patient Spontanous Breathing  Post-op Pain: none  Post-op Assessment: Post-op Vital signs reviewed, Patient's Cardiovascular Status Stable, Respiratory Function Stable, Patent Airway, No signs of Nausea or vomiting and Pain level controlled  Post-op Vital Signs: Reviewed and stable  Complications: No apparent anesthesia complications

## 2011-06-15 NOTE — Brief Op Note (Signed)
Pre-Op Dx: Cataract OD Post-Op Dx: Cataract OD Surgeon: Stephane Niemann Anesthesia: Topical with MAC Surgery: Cataract Extraction with Intraocular lens Implant OD Implant: Lenstec, Model Softec HD Blood Loss: None Specimen: None Complications: None 

## 2011-06-15 NOTE — Op Note (Signed)
[name], [name]            ACCOUNT NO.:  [id number]  MEDICAL RECORD NO.:  [id number]  LOCATION:  [facility]                          FACILITY:  [facility]  PHYSICIAN:  Susanne Greenhouse, MD       DATE OF BIRTH:  06/28/1923  DATE OF PROCEDURE:  06/15/2011 DATE OF DISCHARGE:  06/15/2011                              OPERATIVE REPORT   PREOPERATIVE DIAGNOSIS:  Nuclear cataract, right eye, diagnosis code 366.16.  POSTOPERATIVE DIAGNOSIS:  Nuclear cataract, right eye, diagnosis code 366.16.  OPERATION PERFORMED:  Phacoemulsification with posterior chamber intraocular lens implantation, right eye.  SURGEON:  Bonne Dolores. Alto Denver, MD  ANESTHESIA:  Local with MAC.  OPERATIVE SUMMARY:  In the preoperative area, dilating drops were placed into the right eye.  The patient was then brought into the operating room where she was placed under general anesthesia.  The eye was then prepped and draped.  Beginning with a 75 blade, a paracentesis port was made at the surgeon's 2 o'clock position.  The anterior chamber was then filled with a 1% nonpreserved lidocaine solution with epinephrine.  This was followed by Viscoat to deepen the chamber.  A small fornix-based peritomy was performed superiorly.  Next, a single iris hook was placed through the limbus superiorly.  A 2.4-mm keratome blade was then used to make a clear corneal incision over the iris hook.  A bent cystotome needle and Utrata forceps were used to create a continuous tear capsulotomy.  Hydrodissection was performed using balanced salt solution on a fine cannula.  The lens nucleus was then removed using phacoemulsification in a quadrant cracking technique.  The cortical material was then removed with irrigation and aspiration.  The capsular bag and anterior chamber were refilled with Provisc.  The wound was widened to approximately 3 mm and a posterior chamber intraocular lens was placed into the capsular bag without difficulty using an  [employer] lens injecting system.  A single 10-0 nylon suture was then used to close the incision as well as stromal hydration.  The Provisc was removed from the anterior chamber and capsular bag with irrigation and aspiration.  At this point, the wounds were tested for leak, which were negative.  The anterior chamber remained deep and stable.  The patient tolerated the procedure well.  There were no operative complications, and she awoke from general anesthesia without problem.  No surgical specimens.  Prosthetic device used is a Lenstec posterior chamber lens, model Softec HD, power of 20.0, serial number is [id number].          ______________________________ Susanne Greenhouse, MD     KEH/MEDQ  D:  06/15/2011  T:  06/15/2011  Job:  [id number]

## 2011-06-15 NOTE — Discharge Instructions (Signed)
[name]  06/15/2011     Instructions  1. Use medications as Instructed.  Shake well before use. Wait 5 minutes between drops.  {OPHTHALMIC ANTIBIOTICS:22167} 4 times a day x 1 week.  {OPHTHALMIC ANTI-INFLAMMATORY:22168} 2 times a day x 4 weeks.  {OPHTHALMIC STEROID:22169} 4 times a day - week 1   3 times a day - Week 2, 2 times a day- Week 3, 1 time a day - Week 4.  2. Do not rub the operative eye. Do not swim underwater for 2 weeks.  3. You may remove the clear shield and resume your normal activities the day after  Surgery. Your eyes may feel more comfortable if you wear dark glasses outside.  4. Call our office at [phone] if you have sudden change in vision, extreme redness or pain. Some fluctuation in vision is normal after surgery. If you have an emergency after hours, call Dr. Alto Denver at [phone].  5. It is important that you attend all of your follow-up appointments.        Follow-up:{follow up:32580} with Gemma Payor, MD.   Dr. Lahoma Crocker: [phone]  Dr. Lita Mains: [phone]  Dr. Alto Denver: [phone]   If you find that you cannot contact your physician, but feel that your signs and   Symptoms warrant a physician's attention, call the Emergency Room at   [phone] ext.[phone].   Other{NA AND WUXLKGMW:10272}.

## 2011-06-15 NOTE — Anesthesia Procedure Notes (Signed)
Procedure Name: MAC Date/Time: 06/15/2011 9:27 AM Performed by: Franco Nones Pre-anesthesia Checklist: Patient identified, Emergency Drugs available, Suction available, Timeout performed and Patient being monitored Patient Re-evaluated:Patient Re-evaluated prior to inductionOxygen Delivery Method: Nasal Cannula

## 2011-06-15 NOTE — H&P (Signed)
I have reviewed the H&P, the patient was re-examined, and I have identified no interval changes in medical condition and plan of care since the history and physical of record  

## 2011-06-15 NOTE — Anesthesia Preprocedure Evaluation (Signed)
Anesthesia Evaluation  Patient identified by MRN, date of birth, ID band Patient awake    Reviewed: Allergy & Precautions, H&P , NPO status , Patient's Chart, lab work & pertinent test results  Airway Mallampati: II      Dental  (+) Teeth Intact   Pulmonary  breath sounds clear to auscultation        Cardiovascular hypertension, + CAD and +CHF + dysrhythmias Supra Ventricular Tachycardia + Cardiac Defibrillator Rhythm:Regular Rate:Normal     Neuro/Psych    GI/Hepatic   Endo/Other    Renal/GU      Musculoskeletal   Abdominal   Peds  Hematology   Anesthesia Other Findings   Reproductive/Obstetrics                           Anesthesia Physical Anesthesia Plan  ASA: III  Anesthesia Plan: MAC   Post-op Pain Management:    Induction: Intravenous  Airway Management Planned: Nasal Cannula  Additional Equipment:   Intra-op Plan:   Post-operative Plan:   Informed Consent: I have reviewed the patients History and Physical, chart, labs and discussed the procedure including the risks, benefits and alternatives for the proposed anesthesia with the patient or authorized representative who has indicated his/her understanding and acceptance.     Plan Discussed with:   Anesthesia Plan Comments:         Anesthesia Quick Evaluation  

## 2011-06-15 NOTE — Transfer of Care (Signed)
Immediate Anesthesia Transfer of Care Note  Patient: [name]  Procedure(s) Performed: Procedure(s) (LRB): CATARACT EXTRACTION PHACO AND INTRAOCULAR LENS PLACEMENT (IOC) (Right)  Patient Location: Shortstay  Anesthesia Type: MAC  Level of Consciousness: awake  Airway & Oxygen Therapy: Patient Spontanous Breathing   Post-op Assessment: Report given to PACU RN, Post -op Vital signs reviewed and stable and Patient moving all extremities  Post vital signs: Reviewed and stable  Complications: No apparent anesthesia complications

## 2011-06-19 ENCOUNTER — Encounter (HOSPITAL_COMMUNITY): Payer: Self-pay | Admitting: Ophthalmology

## 2011-06-21 ENCOUNTER — Other Ambulatory Visit: Payer: Self-pay | Admitting: Cardiology

## 2011-06-21 ENCOUNTER — Emergency Department (HOSPITAL_COMMUNITY)
Admission: EM | Admit: 2011-06-21 | Discharge: 2011-06-21 | Disposition: A | Discharge status: Home / Self Care | Payer: Medicare Other | Attending: Emergency Medicine | Admitting: Emergency Medicine

## 2011-06-21 ENCOUNTER — Emergency Department (HOSPITAL_COMMUNITY): Payer: Medicare Other

## 2011-06-21 ENCOUNTER — Other Ambulatory Visit: Payer: Self-pay | Admitting: Internal Medicine

## 2011-06-21 ENCOUNTER — Encounter (HOSPITAL_COMMUNITY): Payer: Self-pay

## 2011-06-21 DIAGNOSIS — I10 Essential (primary) hypertension: Secondary | ICD-10-CM | POA: Insufficient documentation

## 2011-06-21 DIAGNOSIS — H409 Unspecified glaucoma: Secondary | ICD-10-CM | POA: Insufficient documentation

## 2011-06-21 DIAGNOSIS — R0989 Other specified symptoms and signs involving the circulatory and respiratory systems: Secondary | ICD-10-CM

## 2011-06-21 DIAGNOSIS — I509 Heart failure, unspecified: Secondary | ICD-10-CM | POA: Insufficient documentation

## 2011-06-21 DIAGNOSIS — R259 Unspecified abnormal involuntary movements: Secondary | ICD-10-CM | POA: Insufficient documentation

## 2011-06-21 DIAGNOSIS — Z95810 Presence of automatic (implantable) cardiac defibrillator: Secondary | ICD-10-CM | POA: Insufficient documentation

## 2011-06-21 DIAGNOSIS — R6889 Other general symptoms and signs: Secondary | ICD-10-CM | POA: Insufficient documentation

## 2011-06-21 NOTE — ED Provider Notes (Signed)
History   This chart was scribed for [name] Jakes, MD by Shari Heritage. The patient was seen in room [facility]. Patient's care was started at 1845.  CSN: [id number]  Arrival date & time 06/21/11  1845   First MD Initiated Contact with Patient 06/21/11 1911      Chief Complaint  Patient presents with  . Swallowed Foreign Body    (Consider location/radiation/quality/duration/timing/severity/associated sxs/prior treatment) The history is provided by the patient. No language interpreter was used.   [name] is a 78 y.o. female who presents to the Emergency Department complaining of a foreign body (trout bone) lodged in her throat onset 1 week ago while eating at a restaurant. Patient reports that she has no trouble swallowing food or fluids and feels no pain in her throat. Patient says that she feels the foreign body at the angle of the jaw. Patient recalls that the bone was very small. Patient reports having tried soda, lemon, orange juice to dissolve the bone. Patient denies fever, chills, cough, nausea, vomiting, edema, dysuria, erythema, neck pain, chest pain and abdominal pain  Patient with h/o CHF, hypertension and tremor.  PCP - Gerda Diss   Past Medical History  Diagnosis Date  . Glaucoma   . CHF (congestive heart failure)   . HTN (hypertension)   . Tremor     sees Dr. Gerilyn Pilgrim, neurologist  . Arthritis   . Hip pain, left     Past Surgical History  Procedure Date  . Icd implantation 2003    [employer]; remote - no  . Ankle fracture surgery 1992    left  . Bladder suspension   . Abdominal hysterectomy 1973    partial  . Tonsillectomy ages 14 and 42    [place]  . Cataract extraction w/phaco 06/15/2011    Procedure: CATARACT EXTRACTION PHACO AND INTRAOCULAR LENS PLACEMENT (IOC);  Surgeon: Gemma Payor, MD;  Location: [facility];  Service: Ophthalmology;  Laterality: Right;  CDE:20.26    Family History  Problem Relation Age of Onset  . Coronary artery  disease      family history; female <65    History  Substance Use Topics  . Smoking status: Never Smoker   . Smokeless tobacco: Never Used   Comment: tobacco - use  . Alcohol Use: No    OB History    Grav Para Term Preterm Abortions TAB SAB Ect Mult Living                  Review of Systems Patient is positive for left hip pain. Patient is negative for fever, chills, cough, nausea, vomiting, edema, dysuria, erythema, neck pain, chest pain and abdominal pain.  Allergies  Review of patient's allergies indicates no known allergies.  Home Medications   Current Outpatient Rx  Name Route Sig Dispense Refill  . ALDACTONE 25 MG PO TABS  TAKE ONE TABLET DAILY. 30 each 6  . AMIODARONE HCL 200 MG PO TABS  Decrease in dose/Take 1 tablet Monday through Friday and 1/2 tablet on Saturday and Sunday 30 tablet 11  . CALCIUM-MAGNESIUM-ZINC 1000-400-15 MG PO TABS Oral Take 1 tablet by mouth every evening.     Marland Kitchen CARVEDILOL 3.125 MG PO TABS Oral Take 1 tablet (3.125 mg total) by mouth 2 (two) times daily with a meal. 60 tablet 6  . LOTENSIN 10 MG PO TABS  TAKE ONE TABLET DAILY. 30 each 6  . ADULT MULTIVITAMIN W/MINERALS CH Oral Take 1 tablet by mouth every  morning.    Marland Kitchen NAPROXEN SODIUM 220 MG PO TABS Oral Take 440 mg by mouth 2 (two) times daily as needed. For pain    . PRIMIDONE 50 MG PO TABS Oral Take 50 mg by mouth 2 (two) times daily.     . TRAVOPROST (BAK FREE) 0.004 % OP SOLN Both Eyes Place 1 drop into both eyes at bedtime.      BP 126/63  Pulse 49  Temp(Src) 97.8 F (36.6 C) (Oral)  Resp 17  SpO2 100%  Physical Exam  Constitutional: She is oriented to person, place, and time. She appears well-developed and well-nourished.  HENT:  Head: Normocephalic and atraumatic.  Eyes: Pupils are equal, round, and reactive to light.  Neck: Normal range of motion. Neck supple.  Cardiovascular: Normal rate, regular rhythm and normal heart sounds.   No murmur heard. Pulmonary/Chest: Effort  normal and breath sounds normal. No respiratory distress. She has no wheezes. She has no rales.  Abdominal: Bowel sounds are normal.  Musculoskeletal: Normal range of motion. She exhibits no edema.  Neurological: She is alert and oriented to person, place, and time.  Skin: Skin is warm and dry. No erythema.    ED Course  Procedures (including critical care time) DIAGNOSTIC STUDIES: Oxygen Saturation is 100% on room air, normal by my interpretation.    COORDINATION OF CARE: 8:06PM- Patient informed of current plan for treatment and evaluation and agrees with plan at this time. I have ordered an X-ray of the neck.   Labs Reviewed - No data to display Dg Neck Soft Tissue  06/21/2011  *RADIOLOGY REPORT*  Clinical Data: Swallowed a fish bone  NECK SOFT TISSUES - 1+ VIEW  Comparison: None.  Findings: No radiodense foreign body is evident.  There is no prevertebral soft tissue swelling or retropharyngeal gas. Aryepiglottic folds are unremarkable.  Multiple missing teeth. Degenerative changes in the cervical spine, incompletely assessed. Left subclavian AICD device is partially seen.  IMPRESSION:  1.  No radiodense foreign body or other acute abnormality.  Original Report Authenticated By: Osa Craver, M.D.     1. Foreign body sensation in throat       MDM  Patient with foreign body sensation at the right side angle of the jaw in the pharynx area x-ray without any evidence of foreign body patient was eating trauma Monday got a foreign body sensation since then it does come and go it would probably be a very fine bony may night to show up on x-ray. Patient able to swallow liquids and eat food without any difficulties. Will discharge home and ENT referral made if sensation does not go away in the next 2-4 days. Patient will return for any new or worse symptoms. I personally performed the services described in this documentation, which was scribed in my presence. The recorded information  has been reviewed and considered.     [name] Jakes, MD 06/21/11 2112

## 2011-06-21 NOTE — ED Notes (Signed)
Pt reports eating trout on Monday and got a fish bone stuck in her throat.  Pt denies any pain or difficulty breathing at this time.

## 2011-06-21 NOTE — Discharge Instructions (Signed)
Return for any new or worse symptoms. X-ray shows no evidence of foreign body. However being a fish bone could be very small and sensation continues beyond Monday with followup with your primary care Dr. and also referral given to ear nose and throat.

## 2011-06-21 NOTE — ED Notes (Signed)
Pt reports having a fish bone stuck in throat since Monday. Pt spoke w/ pcp yesterday & referred to the ER.

## 2011-07-05 ENCOUNTER — Other Ambulatory Visit: Payer: Self-pay | Admitting: Internal Medicine

## 2011-07-20 ENCOUNTER — Ambulatory Visit (INDEPENDENT_AMBULATORY_CARE_PROVIDER_SITE_OTHER): Payer: Medicare Other | Admitting: *Deleted

## 2011-07-20 ENCOUNTER — Encounter: Payer: Self-pay | Admitting: Internal Medicine

## 2011-07-20 ENCOUNTER — Other Ambulatory Visit: Payer: Self-pay | Admitting: Internal Medicine

## 2011-07-20 DIAGNOSIS — I5022 Chronic systolic (congestive) heart failure: Secondary | ICD-10-CM

## 2011-07-20 DIAGNOSIS — Z95810 Presence of automatic (implantable) cardiac defibrillator: Secondary | ICD-10-CM

## 2011-07-25 LAB — REMOTE ICD DEVICE
ATRIAL PACING ICD: 21 pct
BRDY-0002RA: 50 {beats}/min
FVT: 0
LV LEAD IMPEDENCE ICD: 544 Ohm
TOT-0006: 20130305000000
TZAT-0001FASTVT: 1
TZAT-0013FASTVT: 2
TZAT-0018FASTVT: NEGATIVE
TZAT-0018FASTVT: NEGATIVE
TZON-0003FASTVT: 375 ms
TZST-0001FASTVT: 3
TZST-0001FASTVT: 6
TZST-0001FASTVT: 7
TZST-0003FASTVT: 41 J
TZST-0003FASTVT: 41 J

## 2011-08-07 ENCOUNTER — Encounter: Payer: Self-pay | Admitting: *Deleted

## 2011-08-21 ENCOUNTER — Other Ambulatory Visit: Payer: Self-pay | Admitting: Cardiology

## 2011-08-21 ENCOUNTER — Encounter: Payer: Self-pay | Admitting: Cardiology

## 2011-08-21 ENCOUNTER — Ambulatory Visit (INDEPENDENT_AMBULATORY_CARE_PROVIDER_SITE_OTHER): Payer: Medicare Other | Admitting: Cardiology

## 2011-08-21 VITALS — BP 115/60 | HR 62 | Ht 60.0 in | Wt 123.8 lb

## 2011-08-21 DIAGNOSIS — I442 Atrioventricular block, complete: Secondary | ICD-10-CM

## 2011-08-21 DIAGNOSIS — Z95810 Presence of automatic (implantable) cardiac defibrillator: Secondary | ICD-10-CM

## 2011-08-21 DIAGNOSIS — I472 Ventricular tachycardia: Secondary | ICD-10-CM

## 2011-08-21 DIAGNOSIS — I5022 Chronic systolic (congestive) heart failure: Secondary | ICD-10-CM

## 2011-08-21 DIAGNOSIS — E785 Hyperlipidemia, unspecified: Secondary | ICD-10-CM

## 2011-08-21 NOTE — Assessment & Plan Note (Signed)
We'll check lipids today and LFTs since she's here.

## 2011-08-21 NOTE — Patient Instructions (Addendum)
**Note De-Identified Zakiyyah Savannah Obfuscation** Your physician recommends that you continue on your current medications as directed. Please refer to the Current Medication list given to you today.  Your physician recommends that you return for lab work in: today  Your physician recommends that you schedule a follow-up appointment in: as needed

## 2011-08-21 NOTE — Assessment & Plan Note (Signed)
Stable. She knows how to manage her diuretic therapy. Protocol reinforced again today.

## 2011-08-21 NOTE — Progress Notes (Signed)
HPI [name] returns today for the evaluation and management of her chronic systolic heart failure. He has had multiple discharges from her doctor later and has been evaluated recently by Dr. Ladona Ridgel. She lost down from 150 215 pounds. She is now back up to about 124-25. She weighs herself every day and knows the diuretic protocol for congestive heart failure management. She's very compliant with her medications.  She denies orthopnea, PND, or edema. She's really been slowed by arthritic hip pain bilaterally and is using a walker.  She needs blood work for amiodarone and her statin therapy today. Primary care is Dr. Gerda Diss.  Past Medical History  Diagnosis Date  . Glaucoma   . CHF (congestive heart failure)   . HTN (hypertension)   . Tremor     sees Dr. Gerilyn Pilgrim, neurologist  . Arthritis   . Hip pain, left     Current Outpatient Prescriptions  Medication Sig Dispense Refill  . ALDACTONE 25 MG tablet TAKE ONE TABLET DAILY.  30 each  6  . amiodarone (PACERONE) 200 MG tablet Decrease in dose/Take 1 tablet Monday through Friday and 1/2 tablet on Saturday and Sunday  30 tablet  11  . Calcium-Magnesium-Zinc 1000-400-15 MG TABS Take 1 tablet by mouth every evening.       Marland Kitchen COREG 3.125 MG tablet TAKE (1) TABLET BY MOUTH TWICE DAILY WITH MEALS.  60 each  6  . LOTENSIN 10 MG tablet TAKE ONE TABLET DAILY.  30 each  6  . Multiple Vitamin (MULITIVITAMIN WITH MINERALS) TABS Take 1 tablet by mouth every morning.      . naproxen sodium (ANAPROX) 220 MG tablet Take 440 mg by mouth 2 (two) times daily as needed. For pain      . primidone (MYSOLINE) 50 MG tablet Take 50 mg by mouth 2 (two) times daily.       . Travoprost, BAK Free, (TRAVATAN) 0.004 % SOLN ophthalmic solution Place 1 drop into both eyes at bedtime.        No Known Allergies  Family History  Problem Relation Age of Onset  . Coronary artery disease      family history; female <65    History   Social History  . Marital Status:  Widowed    Spouse Name: N/A    Number of Children: N/A  . Years of Education: N/A   Occupational History  . Not on file.   Social History Main Topics  . Smoking status: Never Smoker   . Smokeless tobacco: Never Used   Comment: tobacco - use  . Alcohol Use: No  . Drug Use: No  . Sexually Active: Not on file   Other Topics Concern  . Not on file   Social History Narrative   Widowed, retired, does not get regular exercise.     ROS ALL NEGATIVE EXCEPT THOSE NOTED IN HPI  PE  General Appearance: well developed, in no acute distress, frail, chronically ill appearing HEENT: symmetrical face, PERRLA, good dentition  Neck: no JVD, thyromegaly, or adenopathy, trachea midline Chest: symmetric without deformity Cardiac: PMI non-displaced, RRR, normal S1, S2, no gallop or murmur Lung: clear to ausculation and percussion Vascular: all pulses full without bruits  Abdominal: nondistended, nontender, good bowel sounds, no HSM, no bruits Extremities: no cyanosis, clubbing or edema, no sign of DVT, no varicosities  Skin: normal color, no rashes Neuro: alert and oriented x 3, non-focal Pysch: normal affect  EKG  BMET    Component Value Date/Time  NA 137 06/08/2011 0930   K 5.2* 06/08/2011 0930   CL 99 06/08/2011 0930   CO2 30 06/08/2011 0930   GLUCOSE 75 06/08/2011 0930   BUN 22 06/08/2011 0930   CREATININE 0.88 06/08/2011 0930   CREATININE 0.96 08/12/2010 0822   CALCIUM 10.3 06/08/2011 0930   GFRNONAA 58* 06/08/2011 0930   GFRAA 68* 06/08/2011 0930    Lipid Panel     Component Value Date/Time   CHOL 168 09/16/2010 0845   TRIG 132 09/16/2010 0845   HDL 51 09/16/2010 0845   CHOLHDL 3.3 09/16/2010 0845   VLDL 26 09/16/2010 0845   LDLCALC 91 09/16/2010 0845    CBC    Component Value Date/Time   WBC 6.7 08/12/2010 0822   RBC 4.55 08/12/2010 0822   HGB 12.4 06/08/2011 0930   HCT 38.5 06/08/2011 0930   PLT 393 08/12/2010 0822   MCV 91.2 08/12/2010 0822   MCH 28.6 08/12/2010 0822   MCHC 31.3  08/12/2010 0822   RDW 14.5 08/12/2010 0822   LYMPHSABS 2.2 08/12/2010 0822   MONOABS 0.6 08/12/2010 0822   EOSABS 0.3 08/12/2010 0822   BASOSABS 0.1 08/12/2010 1610

## 2011-08-21 NOTE — Assessment & Plan Note (Signed)
Finally stable. Check amiodarone labs. Schedule follow Dr. Ladona Ridgel in 3 months or less.

## 2011-08-22 LAB — LIPID PANEL
HDL: 56 mg/dL (ref >39)
Triglycerides: 145 mg/dL (ref <150)

## 2011-08-22 LAB — COMPREHENSIVE METABOLIC PANEL
AST: 18 U/L (ref 0–37)
Albumin: 3.8 g/dL (ref 3.5–5.2)
BUN: 19 mg/dL (ref 6–23)
Calcium: 9.4 mg/dL (ref 8.4–10.5)
Chloride: 104 mEq/L (ref 96–112)
Glucose, Bld: 70 mg/dL (ref 70–99)
Potassium: 4.7 mEq/L (ref 3.5–5.3)
Total Protein: 6.3 g/dL (ref 6.0–8.3)

## 2011-08-22 LAB — TSH: TSH: 2.677 u[IU]/mL (ref 0.350–4.500)

## 2011-08-25 ENCOUNTER — Telehealth: Payer: Self-pay | Admitting: Cardiology

## 2011-08-25 NOTE — Telephone Encounter (Signed)
**Note De-Identified Ashtan Laton Obfuscation** LMOM and mailed results to pt's address./LV

## 2011-08-25 NOTE — Telephone Encounter (Signed)
Patient calling requesting lab results.  Please call patient at home.  She would also like a copy of the results.

## 2011-09-01 ENCOUNTER — Encounter (HOSPITAL_COMMUNITY): Payer: Self-pay | Admitting: Pharmacy Technician

## 2011-09-11 ENCOUNTER — Encounter (HOSPITAL_COMMUNITY)
Admission: RE | Admit: 2011-09-11 | Discharge: 2011-09-11 | Payer: Medicare Other | Source: Ambulatory Visit | Admitting: Ophthalmology

## 2011-09-11 ENCOUNTER — Encounter (HOSPITAL_COMMUNITY): Payer: Self-pay

## 2011-09-13 MED ORDER — PHENYLEPHRINE HCL 2.5 % OP SOLN
OPHTHALMIC | Status: AC
  Filled 2011-09-13: qty 2

## 2011-09-13 MED ORDER — LIDOCAINE HCL 3.5 % OP GEL
OPHTHALMIC | Status: AC
  Filled 2011-09-13: qty 5

## 2011-09-13 MED ORDER — TETRACAINE HCL 0.5 % OP SOLN
OPHTHALMIC | Status: AC
  Filled 2011-09-13: qty 2

## 2011-09-13 MED ORDER — LIDOCAINE HCL (PF) 1 % IJ SOLN
INTRAMUSCULAR | Status: AC
  Filled 2011-09-13: qty 2

## 2011-09-13 MED ORDER — CYCLOPENTOLATE-PHENYLEPHRINE 0.2-1 % OP SOLN
OPHTHALMIC | Status: AC
  Filled 2011-09-13: qty 2

## 2011-09-13 MED ORDER — NEOMYCIN-POLYMYXIN-DEXAMETH 3.5-10000-0.1 OP OINT
TOPICAL_OINTMENT | OPHTHALMIC | Status: AC
  Filled 2011-09-13: qty 3.5

## 2011-09-13 NOTE — OR Nursing (Signed)
Ok to use H&H performed in 05/2011,  And K+ from 08/2011

## 2011-09-14 ENCOUNTER — Ambulatory Visit (HOSPITAL_COMMUNITY)
Admission: RE | Admit: 2011-09-14 | Discharge: 2011-09-14 | Disposition: A | Discharge status: Home / Self Care | Payer: Medicare Other | Source: Ambulatory Visit | Attending: Ophthalmology | Admitting: Ophthalmology

## 2011-09-14 ENCOUNTER — Encounter (HOSPITAL_COMMUNITY): Payer: Self-pay | Admitting: Anesthesiology

## 2011-09-14 ENCOUNTER — Encounter (HOSPITAL_COMMUNITY)
Admission: RE | Disposition: A | Discharge status: Home / Self Care | Payer: Self-pay | Source: Ambulatory Visit | Attending: Ophthalmology

## 2011-09-14 ENCOUNTER — Encounter (HOSPITAL_COMMUNITY): Payer: Self-pay | Admitting: *Deleted

## 2011-09-14 ENCOUNTER — Ambulatory Visit (HOSPITAL_COMMUNITY): Payer: Medicare Other | Admitting: Anesthesiology

## 2011-09-14 DIAGNOSIS — H2510 Age-related nuclear cataract, unspecified eye: Secondary | ICD-10-CM | POA: Insufficient documentation

## 2011-09-14 DIAGNOSIS — I10 Essential (primary) hypertension: Secondary | ICD-10-CM | POA: Insufficient documentation

## 2011-09-14 DIAGNOSIS — Z79899 Other long term (current) drug therapy: Secondary | ICD-10-CM | POA: Insufficient documentation

## 2011-09-14 HISTORY — PX: CATARACT EXTRACTION W/PHACO: SHX586

## 2011-09-14 SURGERY — PHACOEMULSIFICATION, CATARACT, WITH IOL INSERTION
Anesthesia: Monitor Anesthesia Care | Site: Eye | Laterality: Left | Wound class: Clean

## 2011-09-14 MED ORDER — PROVISC 10 MG/ML IO SOLN
INTRAOCULAR | Status: DC | PRN
  Administered 2011-09-14: 8.5 mg via INTRAOCULAR

## 2011-09-14 MED ORDER — LIDOCAINE HCL 3.5 % OP GEL
1.0000 "application " | Freq: Once | OPHTHALMIC | Status: AC
  Administered 2011-09-14: 1 via OPHTHALMIC

## 2011-09-14 MED ORDER — NEOMYCIN-POLYMYXIN-DEXAMETH 0.1 % OP OINT
TOPICAL_OINTMENT | OPHTHALMIC | Status: DC | PRN
  Administered 2011-09-14: 1 via OPHTHALMIC

## 2011-09-14 MED ORDER — MIDAZOLAM HCL 2 MG/2ML IJ SOLN
1.0000 mg | INTRAMUSCULAR | Status: DC | PRN
  Administered 2011-09-14: 2 mg via INTRAVENOUS

## 2011-09-14 MED ORDER — EPINEPHRINE HCL 1 MG/ML IJ SOLN
INTRAOCULAR | Status: DC | PRN
  Administered 2011-09-14: 08:00:00

## 2011-09-14 MED ORDER — CYCLOPENTOLATE-PHENYLEPHRINE 0.2-1 % OP SOLN
1.0000 [drp] | OPHTHALMIC | Status: AC
  Administered 2011-09-14 (×3): 1 [drp] via OPHTHALMIC

## 2011-09-14 MED ORDER — GLYCOPYRROLATE 0.2 MG/ML IJ SOLN
0.1000 mg | Freq: Once | INTRAMUSCULAR | Status: AC
  Administered 2011-09-14: 0.1 mg via INTRAVENOUS

## 2011-09-14 MED ORDER — PHENYLEPHRINE HCL 2.5 % OP SOLN
1.0000 [drp] | OPHTHALMIC | Status: AC
  Administered 2011-09-14 (×3): 1 [drp] via OPHTHALMIC

## 2011-09-14 MED ORDER — MIDAZOLAM HCL 2 MG/2ML IJ SOLN
INTRAMUSCULAR | Status: AC
  Filled 2011-09-14: qty 2

## 2011-09-14 MED ORDER — GLYCOPYRROLATE 0.2 MG/ML IJ SOLN
INTRAMUSCULAR | Status: AC
  Filled 2011-09-14: qty 1

## 2011-09-14 MED ORDER — TETRACAINE HCL 0.5 % OP SOLN
1.0000 [drp] | OPHTHALMIC | Status: AC
  Administered 2011-09-14 (×3): 1 [drp] via OPHTHALMIC

## 2011-09-14 MED ORDER — LACTATED RINGERS IV SOLN
INTRAVENOUS | Status: DC
  Administered 2011-09-14: 1000 mL via INTRAVENOUS

## 2011-09-14 MED ORDER — BSS IO SOLN
INTRAOCULAR | Status: DC | PRN
  Administered 2011-09-14: 15 mL via INTRAOCULAR

## 2011-09-14 MED ORDER — POVIDONE-IODINE 5 % OP SOLN
OPHTHALMIC | Status: DC | PRN
  Administered 2011-09-14: 1 via OPHTHALMIC

## 2011-09-14 MED ORDER — LIDOCAINE HCL (PF) 1 % IJ SOLN
INTRAMUSCULAR | Status: DC | PRN
  Administered 2011-09-14: .4 mL

## 2011-09-14 MED ORDER — EPINEPHRINE HCL 1 MG/ML IJ SOLN
INTRAMUSCULAR | Status: AC
  Filled 2011-09-14: qty 1

## 2011-09-14 MED ORDER — LIDOCAINE 3.5 % OP GEL OPTIME - NO CHARGE
OPHTHALMIC | Status: DC | PRN
  Administered 2011-09-14: 2 [drp] via OPHTHALMIC

## 2011-09-14 SURGICAL SUPPLY — 32 items

## 2011-09-14 NOTE — Anesthesia Preprocedure Evaluation (Signed)
Anesthesia Evaluation  Patient identified by MRN, date of birth, ID band Patient awake    Reviewed: Allergy & Precautions, H&P , NPO status , Patient's Chart, lab work & pertinent test results  Airway Mallampati: II      Dental  (+) Teeth Intact   Pulmonary  breath sounds clear to auscultation        Cardiovascular hypertension, + CAD and +CHF + dysrhythmias Supra Ventricular Tachycardia + Cardiac Defibrillator Rhythm:Regular Rate:Normal     Neuro/Psych    GI/Hepatic   Endo/Other    Renal/GU      Musculoskeletal   Abdominal   Peds  Hematology   Anesthesia Other Findings   Reproductive/Obstetrics                           Anesthesia Physical Anesthesia Plan  ASA: III  Anesthesia Plan: MAC   Post-op Pain Management:    Induction: Intravenous  Airway Management Planned: Nasal Cannula  Additional Equipment:   Intra-op Plan:   Post-operative Plan:   Informed Consent: I have reviewed the patients History and Physical, chart, labs and discussed the procedure including the risks, benefits and alternatives for the proposed anesthesia with the patient or authorized representative who has indicated his/her understanding and acceptance.     Plan Discussed with:   Anesthesia Plan Comments:         Anesthesia Quick Evaluation

## 2011-09-14 NOTE — Transfer of Care (Signed)
Immediate Anesthesia Transfer of Care Note  Patient: [name]  Procedure(s) Performed: Procedure(s) (LRB): CATARACT EXTRACTION PHACO AND INTRAOCULAR LENS PLACEMENT (IOC) (Left)  Patient Location: PACU and Short Stay  Anesthesia Type: MAC  Level of Consciousness: awake, alert , oriented and patient cooperative  Airway & Oxygen Therapy: Patient Spontanous Breathing  Post-op Assessment: Report given to PACU RN, Post -op Vital signs reviewed and stable and Patient moving all extremities  Post vital signs: Reviewed and stable  Complications: No apparent anesthesia complications

## 2011-09-14 NOTE — Op Note (Signed)
[name], [name]            ACCOUNT NO.:  [id number]  MEDICAL RECORD NO.:  [id number]  LOCATION:  [facility]                          FACILITY:  [facility]  PHYSICIAN:  Susanne Greenhouse, MD       DATE OF BIRTH:  07/22/1923  DATE OF PROCEDURE:  09/14/2011 DATE OF DISCHARGE:                              OPERATIVE REPORT   PREOPERATIVE DIAGNOSIS:  Nuclear cataract, left eye, diagnosis code 366.16.  POSTOPERATIVE DIAGNOSIS:  Nuclear cataract, left eye, diagnosis code 366.16.  OPERATION PERFORMED:  Phacoemulsification with posterior chamber intraocular lens implantation, left eye.  SURGEON:  Susanne Greenhouse, MD  ANESTHESIA:  Topical with monitored anesthesia care and IV sedation.  OPERATIVE SUMMARY:  In the preoperative area, dilating drops were placed into the left eye.  The patient was then brought into the operating room where she was placed under general anesthesia.  The eye was then prepped and draped.  Beginning with a #75 blade, a paracentesis port was made at the surgeon's 2 o'clock position.  The anterior chamber was then filled with a 1% nonpreserved lidocaine solution with epinephrine.  This was followed by Viscoat to deepen the chamber.  A small fornix-based peritomy was performed superiorly.  Next, a single iris hook was placed through the limbus superiorly.  A 2.4-mm keratome blade was then used to make a clear corneal incision over the iris hook.  A bent cystotome needle and Utrata forceps were used to create a continuous tear capsulotomy.  Hydrodissection was performed using balanced salt solution on a fine cannula.  The lens nucleus was then removed using phacoemulsification in a quadrant cracking technique.  The cortical material was then removed with irrigation and aspiration.  The capsular bag and anterior chamber were refilled with Provisc.  The wound was widened to approximately 3 mm and a posterior chamber intraocular lens was placed into the capsular bag without  difficulty using an [employer] lens injecting system.  A single 10-0 nylon suture was then used to close the incision as well as stromal hydration.  The Provisc was removed from the anterior chamber and capsular bag with irrigation and aspiration.  At this point, the wounds were tested for leak, which were negative.  The anterior chamber remained deep and stable.  The patient tolerated the procedure well.  There were no operative complications, and she awoke from general anesthesia without problem.  No surgical specimens.  Prosthetic device used is a Lenstec posterior chamber lens, model Softec HD, power of 21.25, serial number is [id number].          ______________________________ Susanne Greenhouse, MD     KEH/MEDQ  D:  09/14/2011  T:  09/14/2011  Job:  [id number]

## 2011-09-14 NOTE — H&P (Signed)
I have reviewed the H&P, the patient was re-examined, and I have identified no interval changes in medical condition and plan of care since the history and physical of record  

## 2011-09-14 NOTE — Brief Op Note (Signed)
Pre-Op Dx: Cataract OS Post-Op Dx: Cataract OS Surgeon: Tesslyn Baumert Anesthesia: Topical with MAC Surgery: Cataract Extraction with Intraocular lens Implant OS Implant: Lenstec, Model Softec HD Specimen: None Complications: None 

## 2011-09-14 NOTE — Anesthesia Postprocedure Evaluation (Signed)
  Anesthesia Post-op Note  Patient: [name]  Procedure(s) Performed: Procedure(s) (LRB): CATARACT EXTRACTION PHACO AND INTRAOCULAR LENS PLACEMENT (IOC) (Left)  Patient Location: PACU and Short Stay  Anesthesia Type: MAC  Level of Consciousness: awake, alert , oriented and patient cooperative  Airway and Oxygen Therapy: Patient Spontanous Breathing  Post-op Pain: none  Post-op Assessment: Post-op Vital signs reviewed, Patient's Cardiovascular Status Stable, Respiratory Function Stable, Patent Airway and Pain level controlled  Post-op Vital Signs: Reviewed and stable  Complications: No apparent anesthesia complications

## 2011-09-18 ENCOUNTER — Encounter (HOSPITAL_COMMUNITY): Payer: Self-pay | Admitting: Ophthalmology

## 2011-11-02 ENCOUNTER — Ambulatory Visit (INDEPENDENT_AMBULATORY_CARE_PROVIDER_SITE_OTHER): Payer: Medicare Other | Admitting: *Deleted

## 2011-11-02 ENCOUNTER — Other Ambulatory Visit: Payer: Self-pay | Admitting: Internal Medicine

## 2011-11-02 DIAGNOSIS — I472 Ventricular tachycardia, unspecified: Secondary | ICD-10-CM

## 2011-11-02 DIAGNOSIS — Z95810 Presence of automatic (implantable) cardiac defibrillator: Secondary | ICD-10-CM

## 2011-11-02 DIAGNOSIS — I4729 Other ventricular tachycardia: Secondary | ICD-10-CM

## 2011-11-08 LAB — REMOTE ICD DEVICE
DEVICE MODEL ICD: 583543
FVT: 0
LV LEAD IMPEDENCE ICD: 617 Ohm
RV LEAD IMPEDENCE ICD: 480 Ohm
TZAT-0001FASTVT: 2
TZAT-0013FASTVT: 2
TZON-0003FASTVT: 375 ms
TZST-0001FASTVT: 3
TZST-0001FASTVT: 6
TZST-0001FASTVT: 7
TZST-0003FASTVT: 41 J
TZST-0003FASTVT: 41 J
VF: 0

## 2011-11-28 ENCOUNTER — Encounter: Payer: Self-pay | Admitting: Internal Medicine

## 2012-01-15 ENCOUNTER — Emergency Department (HOSPITAL_COMMUNITY): Payer: Medicare Other

## 2012-01-15 ENCOUNTER — Inpatient Hospital Stay (HOSPITAL_COMMUNITY)
Admission: EM | Admit: 2012-01-15 | Discharge: 2012-01-19 | DRG: 690 | Disposition: A | Discharge status: Home / Self Care | Payer: Medicare Other | Attending: Internal Medicine | Admitting: Internal Medicine

## 2012-01-15 ENCOUNTER — Encounter (HOSPITAL_COMMUNITY): Payer: Self-pay | Admitting: *Deleted

## 2012-01-15 DIAGNOSIS — I5022 Chronic systolic (congestive) heart failure: Secondary | ICD-10-CM

## 2012-01-15 DIAGNOSIS — M1990 Unspecified osteoarthritis, unspecified site: Secondary | ICD-10-CM

## 2012-01-15 DIAGNOSIS — Z23 Encounter for immunization: Secondary | ICD-10-CM

## 2012-01-15 DIAGNOSIS — I472 Ventricular tachycardia, unspecified: Secondary | ICD-10-CM

## 2012-01-15 DIAGNOSIS — I10 Essential (primary) hypertension: Secondary | ICD-10-CM

## 2012-01-15 DIAGNOSIS — Z79899 Other long term (current) drug therapy: Secondary | ICD-10-CM

## 2012-01-15 DIAGNOSIS — I442 Atrioventricular block, complete: Secondary | ICD-10-CM

## 2012-01-15 DIAGNOSIS — M25552 Pain in left hip: Secondary | ICD-10-CM | POA: Diagnosis present

## 2012-01-15 DIAGNOSIS — R42 Dizziness and giddiness: Secondary | ICD-10-CM

## 2012-01-15 DIAGNOSIS — E785 Hyperlipidemia, unspecified: Secondary | ICD-10-CM

## 2012-01-15 DIAGNOSIS — M129 Arthropathy, unspecified: Secondary | ICD-10-CM | POA: Diagnosis present

## 2012-01-15 DIAGNOSIS — H409 Unspecified glaucoma: Secondary | ICD-10-CM | POA: Diagnosis present

## 2012-01-15 DIAGNOSIS — J329 Chronic sinusitis, unspecified: Secondary | ICD-10-CM

## 2012-01-15 DIAGNOSIS — I509 Heart failure, unspecified: Secondary | ICD-10-CM | POA: Diagnosis present

## 2012-01-15 DIAGNOSIS — I4729 Other ventricular tachycardia: Secondary | ICD-10-CM | POA: Diagnosis present

## 2012-01-15 DIAGNOSIS — N390 Urinary tract infection, site not specified: Principal | ICD-10-CM

## 2012-01-15 DIAGNOSIS — R1031 Right lower quadrant pain: Secondary | ICD-10-CM | POA: Diagnosis present

## 2012-01-15 DIAGNOSIS — A498 Other bacterial infections of unspecified site: Secondary | ICD-10-CM | POA: Diagnosis present

## 2012-01-15 DIAGNOSIS — G8929 Other chronic pain: Secondary | ICD-10-CM | POA: Diagnosis present

## 2012-01-15 DIAGNOSIS — K5900 Constipation, unspecified: Secondary | ICD-10-CM

## 2012-01-15 DIAGNOSIS — M25551 Pain in right hip: Secondary | ICD-10-CM

## 2012-01-15 DIAGNOSIS — I428 Other cardiomyopathies: Secondary | ICD-10-CM

## 2012-01-15 DIAGNOSIS — Z95810 Presence of automatic (implantable) cardiac defibrillator: Secondary | ICD-10-CM

## 2012-01-15 HISTORY — DX: Presence of automatic (implantable) cardiac defibrillator: Z95.810

## 2012-01-15 HISTORY — DX: Urinary tract infection, site not specified: N39.0

## 2012-01-15 HISTORY — DX: Ventricular tachycardia: I47.2

## 2012-01-15 HISTORY — DX: Dizziness and giddiness: R42

## 2012-01-15 HISTORY — DX: Other cardiomyopathies: I42.8

## 2012-01-15 LAB — CBC WITH DIFFERENTIAL/PLATELET
Eosinophils Absolute: 0.3 10*3/uL (ref 0.0–0.7)
Hemoglobin: 13.3 g/dL (ref 12.0–15.0)
Lymphocytes Relative: 24 % (ref 12–46)
Lymphs Abs: 1.7 10*3/uL (ref 0.7–4.0)
MCH: 30.9 pg (ref 26.0–34.0)
Monocytes Relative: 7 % (ref 3–12)
Neutro Abs: 4.6 10*3/uL (ref 1.7–7.7)
Neutrophils Relative %: 64 % (ref 43–77)
Platelets: 306 10*3/uL (ref 150–400)
RBC: 4.3 MIL/uL (ref 3.87–5.11)
WBC: 7.1 10*3/uL (ref 4.0–10.5)

## 2012-01-15 LAB — URINE MICROSCOPIC-ADD ON

## 2012-01-15 LAB — BASIC METABOLIC PANEL
BUN: 20 mg/dL (ref 6–23)
CO2: 29 mEq/L (ref 19–32)
Chloride: 101 mEq/L (ref 96–112)
Glucose, Bld: 126 mg/dL — ABNORMAL HIGH (ref 70–99)
Potassium: 4.3 mEq/L (ref 3.5–5.1)
Sodium: 140 mEq/L (ref 135–145)

## 2012-01-15 LAB — HEPATIC FUNCTION PANEL
ALT: 18 U/L (ref 0–35)
AST: 19 U/L (ref 0–37)
Albumin: 3.4 g/dL — ABNORMAL LOW (ref 3.5–5.2)
Alkaline Phosphatase: 67 U/L (ref 39–117)
Bilirubin, Direct: <0.1 mg/dL (ref 0.0–0.3)
Total Bilirubin: 0.2 mg/dL — ABNORMAL LOW (ref 0.3–1.2)
Total Protein: 6.6 g/dL (ref 6.0–8.3)

## 2012-01-15 LAB — TROPONIN I: Troponin I: <0.3 ng/mL (ref <0.30)

## 2012-01-15 LAB — URINALYSIS, ROUTINE W REFLEX MICROSCOPIC
Bilirubin Urine: NEGATIVE
Specific Gravity, Urine: 1.015 (ref 1.005–1.030)
pH: 6 (ref 5.0–8.0)

## 2012-01-15 LAB — MAGNESIUM: Magnesium: 1.9 mg/dL (ref 1.5–2.5)

## 2012-01-15 MED ORDER — NAPROXEN SODIUM 220 MG PO TABS: 220.0000 mg | ORAL_TABLET | Freq: Two times a day (BID) | ORAL | Status: DC | PRN

## 2012-01-15 MED ORDER — BENAZEPRIL HCL 10 MG PO TABS
10.0000 mg | ORAL_TABLET | Freq: Every day | ORAL | Status: DC
Start: 2012-01-16 — End: 2012-01-19
  Administered 2012-01-16 – 2012-01-19 (×4): 10 mg via ORAL
  Filled 2012-01-15 (×4): qty 1

## 2012-01-15 MED ORDER — NAPROXEN 250 MG PO TABS
250.0000 mg | ORAL_TABLET | Freq: Two times a day (BID) | ORAL | Status: DC | PRN
  Administered 2012-01-17: 250 mg via ORAL
  Filled 2012-01-15: qty 1

## 2012-01-15 MED ORDER — FLEET ENEMA 7-19 GM/118ML RE ENEM: 1.0000 | ENEMA | Freq: Once | RECTAL | Status: AC | PRN

## 2012-01-15 MED ORDER — POLYETHYLENE GLYCOL 3350 17 G PO PACK
17.0000 g | PACK | Freq: Every day | ORAL | Status: DC
  Administered 2012-01-16 – 2012-01-19 (×3): 17 g via ORAL
  Filled 2012-01-15 (×4): qty 1

## 2012-01-15 MED ORDER — TRAVOPROST (BAK FREE) 0.004 % OP SOLN
1.0000 [drp] | Freq: Every day | OPHTHALMIC | Status: DC
  Administered 2012-01-16 – 2012-01-18 (×3): 1 [drp] via OPHTHALMIC
  Filled 2012-01-15: qty 2.5

## 2012-01-15 MED ORDER — AMIODARONE HCL 200 MG PO TABS: 200.0000 mg | ORAL_TABLET | ORAL | Status: DC

## 2012-01-15 MED ORDER — LORATADINE 10 MG PO TABS: 10.0000 mg | ORAL_TABLET | Freq: Every day | ORAL | Status: DC

## 2012-01-15 MED ORDER — ENOXAPARIN SODIUM 40 MG/0.4ML ~~LOC~~ SOLN
40.0000 mg | SUBCUTANEOUS | Status: DC
  Administered 2012-01-15 – 2012-01-18 (×4): 40 mg via SUBCUTANEOUS
  Filled 2012-01-15 (×4): qty 0.4

## 2012-01-15 MED ORDER — SODIUM CHLORIDE 0.9 % IJ SOLN: 3.0000 mL | INTRAMUSCULAR | Status: DC | PRN

## 2012-01-15 MED ORDER — BISACODYL 5 MG PO TBEC: 5.0000 mg | DELAYED_RELEASE_TABLET | Freq: Every day | ORAL | Status: DC | PRN

## 2012-01-15 MED ORDER — CARVEDILOL 3.125 MG PO TABS
3.1250 mg | ORAL_TABLET | Freq: Two times a day (BID) | ORAL | Status: DC
  Administered 2012-01-16 (×2): 3.125 mg via ORAL
  Filled 2012-01-15 (×2): qty 1

## 2012-01-15 MED ORDER — SODIUM CHLORIDE 0.9 % IV BOLUS (SEPSIS)
500.0000 mL | Freq: Once | INTRAVENOUS | Status: AC
  Administered 2012-01-15: 500 mL via INTRAVENOUS

## 2012-01-15 MED ORDER — SODIUM CHLORIDE 0.9 % IJ SOLN
3.0000 mL | Freq: Two times a day (BID) | INTRAMUSCULAR | Status: DC
  Administered 2012-01-15 – 2012-01-18 (×3): 3 mL via INTRAVENOUS

## 2012-01-15 MED ORDER — DEXTROSE 5 % IV SOLN
1.0000 g | INTRAVENOUS | Status: DC
  Administered 2012-01-16 – 2012-01-18 (×3): 1 g via INTRAVENOUS
  Filled 2012-01-15 (×6): qty 10

## 2012-01-15 MED ORDER — FLUTICASONE PROPIONATE 50 MCG/ACT NA SUSP
1.0000 | Freq: Every day | NASAL | Status: DC
  Administered 2012-01-16 – 2012-01-19 (×4): 1 via NASAL
  Filled 2012-01-15: qty 16

## 2012-01-15 MED ORDER — PRIMIDONE 50 MG PO TABS
75.0000 mg | ORAL_TABLET | Freq: Two times a day (BID) | ORAL | Status: DC
  Administered 2012-01-15 – 2012-01-19 (×8): 75 mg via ORAL
  Filled 2012-01-15 (×15): qty 1.5

## 2012-01-15 MED ORDER — PNEUMOCOCCAL VAC POLYVALENT 25 MCG/0.5ML IJ INJ
0.5000 mL | INJECTION | INTRAMUSCULAR | Status: AC
  Administered 2012-01-16: 0.5 mL via INTRAMUSCULAR
  Filled 2012-01-15: qty 0.5

## 2012-01-15 MED ORDER — ACETAMINOPHEN 325 MG PO TABS
650.0000 mg | ORAL_TABLET | ORAL | Status: DC | PRN
  Administered 2012-01-18 (×3): 650 mg via ORAL
  Filled 2012-01-15 (×3): qty 2

## 2012-01-15 MED ORDER — SODIUM CHLORIDE 0.9 % IV SOLN: 250.0000 mL | INTRAVENOUS | Status: DC | PRN

## 2012-01-15 MED ORDER — AMIODARONE HCL 200 MG PO TABS: 100.0000 mg | ORAL_TABLET | ORAL | Status: DC

## 2012-01-15 MED ORDER — SPIRONOLACTONE 25 MG PO TABS
25.0000 mg | ORAL_TABLET | Freq: Every day | ORAL | Status: DC
  Administered 2012-01-16 – 2012-01-19 (×4): 25 mg via ORAL
  Filled 2012-01-15 (×4): qty 1

## 2012-01-15 MED ORDER — PRIMIDONE 50 MG PO TABS
ORAL_TABLET | ORAL | Status: AC
  Filled 2012-01-15: qty 2

## 2012-01-15 MED ORDER — ONDANSETRON HCL 4 MG/2ML IJ SOLN: 4.0000 mg | Freq: Four times a day (QID) | INTRAMUSCULAR | Status: DC | PRN

## 2012-01-15 MED ORDER — TRAZODONE HCL 50 MG PO TABS: 25.0000 mg | ORAL_TABLET | Freq: Every evening | ORAL | Status: DC | PRN

## 2012-01-15 MED ORDER — SODIUM CHLORIDE 0.9 % IV SOLN: INTRAVENOUS | Status: DC

## 2012-01-15 MED ORDER — DEXTROSE 5 % IV SOLN
1.0000 g | Freq: Once | INTRAVENOUS | Status: AC
  Administered 2012-01-15: 1 g via INTRAVENOUS
  Filled 2012-01-15: qty 10

## 2012-01-15 NOTE — ED Notes (Signed)
Onset this am, tightness in face and dizziness,  , "my head felt like it was square"

## 2012-01-15 NOTE — ED Provider Notes (Signed)
History   This chart was scribed for Donnetta Hutching, MD by Sofie Rower, ED Scribe. The patient was seen in room APA02/AP[facility]02 and the patient's care was started at 3:09PM.    CSN: [id number]  Arrival date & time 01/15/12  1220   First MD Initiated Contact with Patient 01/15/12 1509      Chief Complaint  Patient presents with  . Dizziness    (Consider location/radiation/quality/duration/timing/severity/associated sxs/prior treatment) The history is provided by the patient and a relative. No language interpreter was used.    [name] is a 78 y.o. female , with a hx of pacemaker and defibrillator placement , who presents to the Emergency Department complaining of sudden, progressively worsening, dizziness, onset today (01/15/12, at 11:00AM). The pt reports she experienced a pressurized sensation within her head this morning, followed by tightness within her face. In addition, the pt informs she has noticed an abnormal scent within her urine recently. The pt utilizes a walker and cane to assist her with ambulation at home. The pt has additional hx of CHF, hypertension, tremor, and arthritis.  The pt denies chest pain and SOB.  The pt does not smoke or drink alcohol.   PCP is Dr. Lubertha South.    Past Medical History  Diagnosis Date  . Glaucoma(365)   . CHF (congestive heart failure)   . HTN (hypertension)   . Tremor     sees Dr. Gerilyn Pilgrim, neurologist  . Arthritis   . Hip pain, left     Past Surgical History  Procedure Date  . Icd implantation 2003    [employer]; remote - no  . Ankle fracture surgery 1992    left  . Bladder suspension   . Abdominal hysterectomy 1973    partial  . Tonsillectomy ages 70 and 18    [place]  . Cataract extraction w/phaco 06/15/2011    Procedure: CATARACT EXTRACTION PHACO AND INTRAOCULAR LENS PLACEMENT (IOC);  Surgeon: Gemma Payor, MD;  Location: [facility];  Service: Ophthalmology;  Laterality: Right;  CDE:20.26  . Cataract extraction  w/phaco 09/14/2011    Procedure: CATARACT EXTRACTION PHACO AND INTRAOCULAR LENS PLACEMENT (IOC);  Surgeon: Gemma Payor, MD;  Location: [facility];  Service: Ophthalmology;  Laterality: Left;  CDE 31.96    Family History  Problem Relation Age of Onset  . Coronary artery disease      family history; female <65    History  Substance Use Topics  . Smoking status: Never Smoker   . Smokeless tobacco: Never Used     Comment: tobacco - use  . Alcohol Use: No    OB History    Grav Para Term Preterm Abortions TAB SAB Ect Mult Living                  Review of Systems  Respiratory: Negative for shortness of breath.   Cardiovascular: Negative for chest pain.  Neurological: Positive for dizziness.  All other systems reviewed and are negative.    Allergies  Review of patient's allergies indicates no known allergies.  Home Medications   Current Outpatient Rx  Name  Route  Sig  Dispense  Refill  . AMIODARONE HCL 200 MG PO TABS   Oral   Take 100-200 mg by mouth daily. Decrease in dose/Take 1 tablet Monday through Friday and 1/2 tablet on Saturday and Sunday         . BENAZEPRIL HCL 10 MG PO TABS   Oral   Take 10 mg by  mouth daily.         Marland Kitchen CALCIUM 600 + D PO   Oral   Take 1 tablet by mouth daily.         Marland Kitchen CARVEDILOL 3.125 MG PO TABS   Oral   Take 3.125 mg by mouth 2 (two) times daily with a meal.         . ADULT MULTIVITAMIN W/MINERALS CH   Oral   Take 1 tablet by mouth every morning.         Marland Kitchen NAPROXEN SODIUM 220 MG PO TABS   Oral   Take 220 mg by mouth 2 (two) times daily as needed. FOR PAIN         . PRIMIDONE 50 MG PO TABS   Oral   Take 75 mg by mouth 2 (two) times daily.          Marland Kitchen SPIRONOLACTONE 25 MG PO TABS   Oral   Take 25 mg by mouth daily.         . TRAVOPROST (BAK FREE) 0.004 % OP SOLN   Both Eyes   Place 1 drop into both eyes at bedtime.           BP 101/50  Pulse 84  Temp 97.9 F (36.6 C) (Oral)  Resp 18  Ht 5' (1.524 m)  Wt  122 lb (55.339 kg)  BMI 23.83 kg/m2  SpO2 93%  Physical Exam  Nursing note and vitals reviewed. Constitutional: She is oriented to person, place, and time. She appears well-developed and well-nourished.  HENT:  Head: Normocephalic and atraumatic.  Eyes: Conjunctivae normal and EOM are normal. Pupils are equal, round, and reactive to light.  Neck: Normal range of motion. Neck supple.  Cardiovascular: Normal rate and normal heart sounds.  An irregular rhythm present.  Pulmonary/Chest: Effort normal and breath sounds normal.  Abdominal: Soft. Bowel sounds are normal.  Musculoskeletal: Normal range of motion.  Neurological: She is alert and oriented to person, place, and time.  Skin: Skin is warm and dry.  Psychiatric: She has a normal mood and affect.    ED Course  Procedures (including critical care time)  DIAGNOSTIC STUDIES: Oxygen Saturation is 93% on room air, adequate by my interpretation.    COORDINATION OF CARE:  3:42 PM- Treatment plan concerning urine infection, administration of IV fluids, and application of antibiotics discussed with patient. Pt agrees with treatment.      Results for orders placed during the hospital encounter of 01/15/12  CBC WITH DIFFERENTIAL      Component Value Range   WBC 7.1  4.0 - 10.5 K/uL   RBC 4.30  3.87 - 5.11 MIL/uL   Hemoglobin 13.3  12.0 - 15.0 g/dL   HCT 16.1  09.6 - 04.5 %   MCV 93.3  78.0 - 100.0 fL   MCH 30.9  26.0 - 34.0 pg   MCHC 33.2  30.0 - 36.0 g/dL   RDW 40.9  81.1 - 91.4 %   Platelets 306  150 - 400 K/uL   Neutrophils Relative 64  43 - 77 %   Neutro Abs 4.6  1.7 - 7.7 K/uL   Lymphocytes Relative 24  12 - 46 %   Lymphs Abs 1.7  0.7 - 4.0 K/uL   Monocytes Relative 7  3 - 12 %   Monocytes Absolute 0.5  0.1 - 1.0 K/uL   Eosinophils Relative 4  0 - 5 %   Eosinophils Absolute 0.3  0.0 -  0.7 K/uL   Basophils Relative 1  0 - 1 %   Basophils Absolute 0.1  0.0 - 0.1 K/uL  BASIC METABOLIC PANEL      Component Value Range     Sodium 140  135 - 145 mEq/L   Potassium 4.3  3.5 - 5.1 mEq/L   Chloride 101  96 - 112 mEq/L   CO2 29  19 - 32 mEq/L   Glucose, Bld 126 (*) 70 - 99 mg/dL   BUN 20  6 - 23 mg/dL   Creatinine, Ser 4.09  0.50 - 1.10 mg/dL   Calcium 9.5  8.4 - 81.1 mg/dL   GFR calc non Af Amer 77 (*) >90 mL/min   GFR calc Af Amer 89 (*) >90 mL/min  URINALYSIS, ROUTINE W REFLEX MICROSCOPIC      Component Value Range   Color, Urine YELLOW  YELLOW   APPearance CLEAR  CLEAR   Specific Gravity, Urine 1.015  1.005 - 1.030   pH 6.0  5.0 - 8.0   Glucose, UA NEGATIVE  NEGATIVE mg/dL   Hgb urine dipstick TRACE (*) NEGATIVE   Bilirubin Urine NEGATIVE  NEGATIVE   Ketones, ur NEGATIVE  NEGATIVE mg/dL   Protein, ur NEGATIVE  NEGATIVE mg/dL   Urobilinogen, UA 0.2  0.0 - 1.0 mg/dL   Nitrite POSITIVE (*) NEGATIVE   Leukocytes, UA NEGATIVE  NEGATIVE  URINE MICROSCOPIC-ADD ON      Component Value Range   WBC, UA 21-50  <3 WBC/hpf  TROPONIN I      Component Value Range   Troponin I <0.30  <0.30 ng/mL   Ct Head Wo Contrast  01/15/2012  *RADIOLOGY REPORT*  Clinical Data: Dizziness.  Tightness in the face.  CT HEAD WITHOUT CONTRAST  Technique:  Contiguous axial images were obtained from the base of the skull through the vertex without contrast.  Comparison: None.  Findings: Mild generalized atrophy is commensurate with age.  No acute cortical infarct, hemorrhage, or mass lesion is present. Hypoattenuation in the right paracentral brain stem is felt be artifactual.  The ventricles are normal size.  No significant extra- axial fluid collection is present.  A polyp or mucous retention cyst is noted anteriorly within the right maxillary sinus.  The paranasal sinuses and mastoid air cells are clear.  Atherosclerotic calcifications are present within the cavernous carotid arteries and the vertebral arteries bilaterally.  IMPRESSION:  1.  Hypoattenuation along the right-sided brainstem is felt be artifactual. 2.  No other acute  intracranial abnormality. 3.  Mild atrophy is within normal limits for age.   Original Report Authenticated By: Marin Roberts, M.D.        No diagnosis found.    MDM  CT head shows no acute findings. Urinalysis shows 21-50 white cells. IV Rocephin 1 g, IV hydration, admit to general medicine.     I personally performed the services described in this documentation, which was scribed in my presence. The recorded information has been reviewed and is accurate.    Donnetta Hutching, MD 01/15/12 1827

## 2012-01-15 NOTE — H&P (Signed)
[facility] History and Physical  [name]  JWJ:[id number]  DOB: Nov 21, 1923   DOA: 01/15/2012   PCP:   [name] Asa, MD  Cardiologist: Dr. Francesca Jewett. [name] Chief Complaint:  Palpitations and lightheadedness this evening  HPI: [name] is an 78 y.o. female.   A very pleasant elderly Caucasian lady, mentally very sharp, but a little hard of hearing, as is on amiodarone, and has a biventricular Walker/ AICD in place for paroxysmal ventricular tachycardia.  She is brought to the emergency room by her son because she developed an episode of lightheadedness, dizziness and numbness of her face, that was associated with a fluttering in her chest. She felt it may be a stroke and knew that it is important to get to the emergency room early, so promptly called her son.  She's had no fevers nor chills, no focal lateralizing weakness, nodes disturbance or swallowing or speech, and her AICD did not shock her, as it has done in the past.  She has had foul-smelling urine for the past 3 days, for which she has been self-medicating with cranberry juice.  In the emergency room she was evaluated, CT scan of the brain was unremarkable, EKG rhythm was noted to be paced but irregular, and hospitalist service was called for admission for urinary tract infection.  She ambulates with the assistance of walker or cane due to  left hip; hip replacement has been recommended.   Rewiew of Systems:   All systems negative except as marked bold or noted in the HPI;  Constitutional: Negative for malaise, fever and chills. ;  Eyes: Negative for eye pain, redness and discharge. ;  ENMT: Negative for ear pain, hoarseness, nasal congestion, sinus pressure and sore throat. ; Mild hard of hearing chronic postnasal drip Cardiovascular: Negative for chest pain, diaphoresis, dyspnea and peripheral edema. ;  Respiratory: Negative for cough, hemoptysis, wheezing and stridor. ;  Gastrointestinal: Negative  for nausea, vomiting, diarrhea, melena, blood in stool, hematemesis, jaundice and rectal bleeding. unusual weight loss..  constipation,  episodic right lower quadrant abdominal pain Genitourinary: Negative for frequency, dysuria, incontinence,flank pain and hematuria; Musculoskeletal: Negative for back pain and neck pain. Negative for swelling and trauma.; Left hip osteo-arthritic pain Skin: . Negative for pruritus, rash, abrasions, bruising and skin lesion.; ulcerations Neuro: Negative for headache, li neck stiffness. Negative for weakness, altered level of consciousness , altered mental status, extremity weakness, burning feet, seizure and syncope. Essential tremor Psych: negative for anxiety, depression, insomnia, tearfulness, panic attacks, hallucinations, paranoia, suicidal or homicidal ideation    Past Medical History  Diagnosis Date  . Glaucoma(365)   . CHF (congestive heart failure)   . HTN (hypertension)   . Tremor     sees Dr. Gerilyn Pilgrim, neurologist  . Arthritis   . Hip pain, left     Past Surgical History  Procedure Date  . Icd implantation 2003    [employer]; remote - no  . Ankle fracture surgery 1992    left  . Bladder suspension   . Abdominal hysterectomy 1973    partial  . Tonsillectomy ages 77 and 80    [place]. [place]  . Cataract extraction w/phaco 06/15/2011    Procedure: CATARACT EXTRACTION PHACO AND INTRAOCULAR LENS PLACEMENT (IOC);  Surgeon: Gemma Payor, MD;  Location: [facility];  Service: Ophthalmology;  Laterality: Right;  CDE:20.26  . Cataract extraction w/phaco 09/14/2011    Procedure: CATARACT EXTRACTION PHACO AND INTRAOCULAR LENS PLACEMENT (IOC);  Surgeon: Gemma Payor, MD;  Location: [facility];  Service: Ophthalmology;  Laterality: Left;  CDE 31.96    Medications:  HOME MEDS: Prior to Admission medications   Medication Sig Start Date End Date Taking? Authorizing Provider  amiodarone (PACERONE) 200 MG tablet Take 100-200 mg by mouth daily. Decrease in dose/Take 1  tablet Monday through Friday and 1/2 tablet on Saturday and Sunday 04/21/11  Yes Marinus Maw, MD  benazepril (LOTENSIN) 10 MG tablet Take 10 mg by mouth daily.   Yes Historical Provider, MD  Calcium Carbonate-Vitamin D (CALCIUM 600 + D PO) Take 1 tablet by mouth daily.   Yes Historical Provider, MD  carvedilol (COREG) 3.125 MG tablet Take 3.125 mg by mouth 2 (two) times daily with a meal.   Yes Historical Provider, MD  Multiple Vitamin (MULITIVITAMIN WITH MINERALS) TABS Take 1 tablet by mouth every morning.   Yes Historical Provider, MD  naproxen sodium (ANAPROX) 220 MG tablet Take 220 mg by mouth 2 (two) times daily as needed. FOR PAIN   Yes Historical Provider, MD  primidone (MYSOLINE) 50 MG tablet Take 75 mg by mouth 2 (two) times daily.  03/30/11  Yes Historical Provider, MD  spironolactone (ALDACTONE) 25 MG tablet Take 25 mg by mouth daily.   Yes Historical Provider, MD  Travoprost, BAK Free, (TRAVATAN) 0.004 % SOLN ophthalmic solution Place 1 drop into both eyes at bedtime.   Yes Historical Provider, MD     Allergies:  No Known Allergies  Social History:   reports that she has never smoked. She has never used smokeless tobacco. She reports that she does not drink alcohol or use illicit drugs.  Family History: Family History  Problem Relation Age of Onset  . Coronary artery disease      family history; female <65     Physical Exam: Filed Vitals:   01/15/12 1551 01/15/12 1740 01/15/12 1835 01/15/12 1928  BP: 111/66 113/80 100/62   Pulse: 84 86 77   Temp:      TempSrc:      Resp: 26 20 28    Height:      Weight:    57.153 kg (126 lb)  SpO2: 93% 96% 93%    Blood pressure 100/62, pulse 77, temperature 97.9 F (36.6 C), temperature source Oral, resp. rate 28, height 5' (1.524 m), weight 57.153 kg (126 lb), SpO2 93.00%.  GEN:  Pleasant elderly Caucasian person lying in the stretcher in no acute distress; cooperative with exam PSYCH:  alert and oriented x4; does appear a little  anxious; affect is appropriate. HEENT: Mucous membranes pink, dry and anicteric; PERRLA; EOM intact; no cervical lymphadenopathy nor thyromegaly or carotid bruit; no JVD; Breasts:: Not examined CHEST WALL: No tenderness, left upper chest AICD in place CHEST: Normal respiration, clear to auscultation bilaterally HEART: Irregular rate and rhythm; no murmurs rubs or gallops BACK: No kyphosis or scoliosis; no CVA tenderness ABDOMEN: , soft non-tender; no masses, no organomegaly, normal abdominal bowel sounds; no pannus; no intertriginous candida. Rectal Exam: Not done EXTREMITIES: No bone or joint deformity; age-appropriate arthropathy of the hands and knees; no edema; no ulcerations. Genitalia: not examined PULSES: 2+ and symmetric SKIN: Normal hydration no rash or ulceration CNS: Cranial nerves 2-12 grossly intact no focal lateralizing neurologic deficit   Labs on Admission:  Basic Metabolic Panel:  Lab 01/15/12 9604  NA 140  K 4.3  CL 101  CO2 29  GLUCOSE 126*  BUN 20  CREATININE 0.69  CALCIUM 9.5  MG --  PHOS --  Liver Function Tests: No results found for this basename: AST:5,ALT:5,ALKPHOS:5,BILITOT:5,PROT:5,ALBUMIN:5 in the last 168 hours No results found for this basename: LIPASE:5,AMYLASE:5 in the last 168 hours No results found for this basename: AMMONIA:5 in the last 168 hours CBC:  Lab 01/15/12 1342  WBC 7.1  NEUTROABS 4.6  HGB 13.3  HCT 40.1  MCV 93.3  PLT 306   Cardiac Enzymes:  Lab 01/15/12 1603  CKTOTAL --  CKMB --  CKMBINDEX --  TROPONINI <0.30   BNP: No components found with this basename: POCBNP:5 D-dimer: No components found with this basename: D-DIMER:5 CBG: No results found for this basename: GLUCAP:5 in the last 168 hours  Radiological Exams on Admission: Ct Head Wo Contrast  01/15/2012  *RADIOLOGY REPORT*  Clinical Data: Dizziness.  Tightness in the face.  CT HEAD WITHOUT CONTRAST  Technique:  Contiguous axial images were obtained from  the base of the skull through the vertex without contrast.  Comparison: None.  Findings: Mild generalized atrophy is commensurate with age.  No acute cortical infarct, hemorrhage, or mass lesion is present. Hypoattenuation in the right paracentral brain stem is felt be artifactual.  The ventricles are normal size.  No significant extra- axial fluid collection is present.  A polyp or mucous retention cyst is noted anteriorly within the right maxillary sinus.  The paranasal sinuses and mastoid air cells are clear.  Atherosclerotic calcifications are present within the cavernous carotid arteries and the vertebral arteries bilaterally.  IMPRESSION:  1.  Hypoattenuation along the right-sided brainstem is felt be artifactual. 2.  No other acute intracranial abnormality. 3.  Mild atrophy is within normal limits for age.   Original Report Authenticated By: Marin Roberts, M.D.     EKG: Independently reviewed. Ventricular pacing, irregular rhythm   Assessment/Plan Present on Admission:   . UTI (urinary tract infection)  . Dizziness,  Transient, likely due to a cardiac dysrhythmia . Paroxysmal ventricular tachycardia . AUTOMATIC IMPLANTABLE CARDIAC DEFIBRILLATOR SITU . Chronic systolic heart failure  . Hyperlipidemia . Chronic sinusitis . Constipation . HTN (hypertension) Right lower quadrant abdominal pain of unclear   PLAN: We'll admit this lady for continued treatment of her urinary tract infection; will use Rocephin pending results of cultures Will get a cardiology evaluation to ensure her pacemaker is functioning adequatly, since she appears to have had an episode of arrhythmia causing lightheadedness and dizziness, and she continues to have cardiac irregularities although rhythm is unclear.  She complains of right lower quadrant pain and tenderness and has never had a colonoscopy; will give her MiraLAX for obstipation and defer further workup to her primary care physician.  She has been  recommended for left hip replacement and will defer clearance to her cardiologist   Other plans as per orders.  Code Status: FULL CODE - to be re-confirmed  Family Communication: Son at bedside interview and examination  Disposition Plan: Pending evaluation by cardiologist     Gao Mitnick Nocturnist [facility] Pager [phone]   01/15/2012, 9:08 PM

## 2012-01-15 NOTE — ED Notes (Signed)
Reports feeling "lightheaded" onset 1100; states head felt "big and heavy" and like "my cheeks were drawing up".  States she does not feel as lightheaded at present. Alert, oriented x 4, answers questions appropriately.

## 2012-01-16 ENCOUNTER — Encounter: Payer: Self-pay | Admitting: Internal Medicine

## 2012-01-16 DIAGNOSIS — I5022 Chronic systolic (congestive) heart failure: Secondary | ICD-10-CM

## 2012-01-16 DIAGNOSIS — M1990 Unspecified osteoarthritis, unspecified site: Secondary | ICD-10-CM | POA: Diagnosis present

## 2012-01-16 DIAGNOSIS — I442 Atrioventricular block, complete: Secondary | ICD-10-CM

## 2012-01-16 DIAGNOSIS — I472 Ventricular tachycardia: Secondary | ICD-10-CM

## 2012-01-16 DIAGNOSIS — N390 Urinary tract infection, site not specified: Principal | ICD-10-CM

## 2012-01-16 DIAGNOSIS — I10 Essential (primary) hypertension: Secondary | ICD-10-CM

## 2012-01-16 LAB — BASIC METABOLIC PANEL
CO2: 28 mEq/L (ref 19–32)
Calcium: 9 mg/dL (ref 8.4–10.5)
Creatinine, Ser: 0.68 mg/dL (ref 0.50–1.10)
Glucose, Bld: 75 mg/dL (ref 70–99)

## 2012-01-16 LAB — URINE CULTURE: Colony Count: >=100000

## 2012-01-16 LAB — CBC
HCT: 36.6 % (ref 36.0–46.0)
MCHC: 33.1 g/dL (ref 30.0–36.0)
MCV: 93.1 fL (ref 78.0–100.0)
RDW: 13.9 % (ref 11.5–15.5)

## 2012-01-16 MED ORDER — AMIODARONE HCL IN DEXTROSE 360-4.14 MG/200ML-% IV SOLN
60.0000 mg/h | INTRAVENOUS | Status: DC
  Filled 2012-01-16: qty 200

## 2012-01-16 MED ORDER — AMIODARONE HCL IN DEXTROSE 360-4.14 MG/200ML-% IV SOLN
60.0000 mg/h | INTRAVENOUS | Status: DC
  Administered 2012-01-16 – 2012-01-17 (×6): 60 mg/h via INTRAVENOUS
  Filled 2012-01-16 (×8): qty 200

## 2012-01-16 MED ORDER — AMIODARONE LOAD VIA INFUSION
150.0000 mg | Freq: Once | INTRAVENOUS | Status: AC
  Administered 2012-01-16: 150 mg via INTRAVENOUS
  Filled 2012-01-16: qty 83.34

## 2012-01-16 MED ORDER — CARVEDILOL 3.125 MG PO TABS
6.2500 mg | ORAL_TABLET | Freq: Two times a day (BID) | ORAL | Status: DC
  Administered 2012-01-17 – 2012-01-19 (×4): 6.25 mg via ORAL
  Filled 2012-01-16 (×4): qty 2

## 2012-01-16 NOTE — Consult Note (Signed)
CARDIOLOGY CONSULT NOTE  Patient ID: [name] MRN: [id number] DOB/AGE: 04-16-1923 78 y.o.  Admit date: 01/15/2012 Referring Physician: PTH Primary PhysicianLUKING,W S, MD Primary Cardiologist: Valera Castle MD Primary Electrophysiologist: Sharrell Ku MD Reason for Consultation: Cardiac Arrhythmia and Lightheadedness with AICD in situ Principal Problem:  *UTI (urinary tract infection) Active Problems:  Paroxysmal ventricular tachycardia  Nonischemic cardiomyopathy  AUTOMATIC IMPLANTABLE CARDIAC DEFIBRILLATOR SITU  Hyperlipidemia  Dizziness  Chronic sinusitis  Constipation  HTN (hypertension)  HPI: [name] is a 78 y/o patient with known history of nonischemic cardiomyopathy.  Cardiac catheterization in 2011-EF of 25%, normal coronary angiography. Complete heart block with VT, s/p [employer] ICD-CRT-D pacemaker placed July of 2003, hypertension, and hyperlipidemia, admitted with symptoms of lightheadedness, fluttering in her chest, numbness in her face, and dizziness. She was diagnosed with UTI and is being treated with Rocephin. There was concern for CVA in the setting of facial numbness, but CT scan was negative for acute infarct. Heart rate was irregular on assessment per ER. We are asked for recommendations concerning symptoms and pacemaker interrogation.     Remote CRT-D device checked in Sept of 2013 per Dr. Ladona Ridgel.. Sensing consistent with previous device measurements. Lead impedance trends stable over time. No mode switch episodes recorded. No ventricular arrhythmia episodes recorded. Patient bi-ventricularly pacing >90% of the time.     Immediately following admission, patient experienced chest pain and then an AICD discharge.  Review of telemetry showed VT rate of 200 bpm, attempted anti-tachycardia pacing, and recurrent VT with ICD discharge. She felt lightheaded and weak during the episode.   Patient reports urinary frequency and nocturia every hour, but no  fever, rigors or dysuria.   Review of systems complete and found to be negative unless listed above   Past Medical History  Diagnosis Date  . Glaucoma(365)   . CHF (congestive heart failure)   . HTN (hypertension)   . Tremor     sees Dr. Gerilyn Pilgrim, neurologist  . Arthritis   . Hip pain, left     Family History  Problem Relation Age of Onset  . Coronary artery disease      family history; female <65    History   Social History  . Marital Status: Widowed    Spouse Name: N/A    Number of Children: N/A  . Years of Education: N/A   Occupational History  . Not on file.   Social History Main Topics  . Smoking status: Never Smoker   . Smokeless tobacco: Never Used     Comment: tobacco - use  . Alcohol Use: No  . Drug Use: No  . Sexually Active: Not on file   Other Topics Concern  . Not on file   Social History Narrative   Widowed, retired, does not get regular exercise.     Past Surgical History  Procedure Date  . Icd implantation 2003    [employer]; remote - no  . Ankle fracture surgery 1992    left  . Bladder suspension   . Abdominal hysterectomy 1973    partial  . Tonsillectomy ages 89 and 65    [place]  . Cataract extraction w/phaco 06/15/2011    Procedure: CATARACT EXTRACTION PHACO AND INTRAOCULAR LENS PLACEMENT (IOC);  Surgeon: Gemma Payor, MD;  Location: [facility];  Service: Ophthalmology;  Laterality: Right;  CDE:20.26  . Cataract extraction w/phaco 09/14/2011    Procedure: CATARACT EXTRACTION PHACO AND INTRAOCULAR LENS PLACEMENT (IOC);  Surgeon: Gemma Payor, MD;  Location: [facility];  Service: Ophthalmology;  Laterality: Left;  CDE 31.96    Prescriptions prior to admission  Medication Sig Dispense Refill  . amiodarone (PACERONE) 200 MG tablet Take 100-200 mg by mouth daily. Decrease in dose/Take 1 tablet Monday through Friday and 1/2 tablet on Saturday and Sunday      . benazepril (LOTENSIN) 10 MG tablet Take 10 mg by mouth daily.      . Calcium  Carbonate-Vitamin D (CALCIUM 600 + D PO) Take 1 tablet by mouth daily.      . carvedilol (COREG) 3.125 MG tablet Take 3.125 mg by mouth 2 (two) times daily with a meal.      . Multiple Vitamin (MULITIVITAMIN WITH MINERALS) TABS Take 1 tablet by mouth every morning.      . naproxen sodium (ANAPROX) 220 MG tablet Take 220 mg by mouth 2 (two) times daily as needed. FOR PAIN      . primidone (MYSOLINE) 50 MG tablet Take 75 mg by mouth 2 (two) times daily.       Marland Kitchen spironolactone (ALDACTONE) 25 MG tablet Take 25 mg by mouth daily.      . Travoprost, BAK Free, (TRAVATAN) 0.004 % SOLN ophthalmic solution Place 1 drop into both eyes at bedtime.       Physical Exam: Blood pressure 120/66, pulse 108, temperature 98.2 F (36.8 C), temperature source Oral, resp. rate 19, height 5' (1.524 m), weight 126 lb (57.153 kg), SpO2 93.00%.  General: Well developed, well nourished, anxious complaining of chest discomfort. Head: Eyes PERRLA, No xanthomas.   Normal cephalic and atramatic  Lungs: Clear bilaterally to auscultation and percussion. Heart: HRIR  Tachycardic S1 S2, without MRG.  Pulses are 2+ & equal.            No carotid bruit. No JVD.  No abdominal bruits. No femoral bruits. Abdomen: Bowel sounds are positive, abdomen soft and non-tender without masses or                  Hernia's noted. Msk:  Back normal, normal gait. Normal strength and tone for age. Extremities: No clubbing, cyanosis or edema.  DP +1 Neuro: Alert and oriented X 3. Psych:  Anxious affect, responds appropriately  Lab Results  Component Value Date   WBC 8.3 01/16/2012   HGB 12.1 01/16/2012   HCT 36.6 01/16/2012   MCV 93.1 01/16/2012   PLT 271 01/16/2012    Lab 01/16/12 0529 01/15/12 2114  NA 141 --  K 4.4 --  CL 106 --  CO2 28 --  BUN 21 --  CREATININE 0.68 --  CALCIUM 9.0 --  PROT -- 6.6  BILITOT -- 0.2*  ALKPHOS -- 67  ALT -- 18  AST -- 19  GLUCOSE 75 --   Lab Results  Component Value Date   CKTOTAL 55 12/02/2009    CKMB 2.2 12/02/2009   TROPONINI <0.30 01/15/2012     Radiology: Ct Head 01/15/2012  atrophy, right maxillary sinus cyst, vascular calcifications.  EKG: Sinus rhythm, atrial synchronous ventricular pacing, frequent PVCs, PVC pairs, single 3 beat run of ventricular tachycardia.  Telemetry: VT rates to 200, with antitachycardia pacing and recurrence of VT with ICD discharge.    ASSESSMENT AND PLAN:   1. Ventricular Tachycardia: She has ICD-CRT pacemaker [employer] implanted that discharge during episode. She is complaining of chest discomfort since ICD firing. Will move to ICU and begin amiodarone drip, after loading dose, holding p.o.dosing. I have discussed this with  Dr.Taylor who agrees with my plan. She will have ICD interrogated. Likely related to the stress of UTI infective process.   2. Non-ischemic CM: Most recent echocardiogram reveals EF of 25%. Continue coreg 3.125 mg BID, Lisinopril 10 mg daily, along with spironolactone. No evidence of CHF at this time.   3. Hypertension: Blood pressure is well controlled. Continue current regimen.   Bettey Mare. Lyman Bishop NP [facility] 01/16/2012, 7:48 AM  Cardiology Attending Patient interviewed and examined. Discussed with Joni Reining, NP.  Above note annotated and modified based upon my findings.  No apparent congestive heart failure-chest x-ray and BNP level will be obtained. Intravenous amiodarone per Dr. Lubertha Basque recommendation. Medications for congestive heart failure will be titrated. Patient will also be discharged on a higher dose of amiodarone.  UTI appears mild, may represent chronic bacteruria rather than an actual acute infection and likely does not account for her ventricular arrhythmias.   Dorchester Bing, MD 01/16/2012, 9:11 PM

## 2012-01-16 NOTE — Progress Notes (Signed)
Subjective: Events noted for the patient's ICD discharging this morning. She was noted to have nonsustained ventricular tachycardia on telemetry. She was transferred to the ICU by cardiology nurse practitioner Ms. Lawrence. Currently, the patient is lying in bed. She has no complaints of chest pain or shortness of breath at this time. Her only complaint currently is that she is tired and had  little sleep overnight.  Objective: Vital signs in last 24 hours: Filed Vitals:   01/16/12 0640 01/16/12 0644 01/16/12 0801 01/16/12 1018  BP: 118/67 120/66 125/69 111/74  Pulse: 95 108 93 86  Temp: 98 F (36.7 C) 98.2 F (36.8 C) 98.2 F (36.8 C) 99.1 F (37.3 C)  TempSrc: Oral Oral Oral Oral  Resp: 20 19  21   Height:      Weight:  57.153 kg (126 lb)    SpO2: 93% 93% 94% 94%    Intake/Output Summary (Last 24 hours) at 01/16/12 1105 Last data filed at 01/16/12 0800  Gross per 24 hour  Intake    360 ml  Output      0 ml  Net    360 ml    Weight change:   Physical exam: General: Pleasant 78 year old Caucasian lady lying in bed, in no acute distress. Lungs: Clear to auscultation bilaterally. Heart: S1, S2, with occasional ectopy. Abdomen: Positive bowel sounds, soft, nontender, nondistended. Extremities: No pedal edema. Neurologic: She is alert and oriented x2. Cranial nerves II through XII are intact.    Lab Results: Basic Metabolic Panel:  Basename 01/16/12 0529 01/15/12 2114 01/15/12 1342  NA 141 -- 140  K 4.4 -- 4.3  CL 106 -- 101  CO2 28 -- 29  GLUCOSE 75 -- 126*  BUN 21 -- 20  CREATININE 0.68 -- 0.69  CALCIUM 9.0 -- 9.5  MG -- 1.9 --  PHOS -- -- --   Liver Function Tests:  [facility] 01/15/12 2114  AST 19  ALT 18  ALKPHOS 67  BILITOT 0.2*  PROT 6.6  ALBUMIN 3.4*   No results found for this basename: LIPASE:2,AMYLASE:2 in the last 72 hours No results found for this basename: AMMONIA:2 in the last 72 hours CBC:  Basename 01/16/12 0529 01/15/12 1342  WBC 8.3  7.1  NEUTROABS -- 4.6  HGB 12.1 13.3  HCT 36.6 40.1  MCV 93.1 93.3  PLT 271 306   Cardiac Enzymes:  Basename 01/15/12 1603  CKTOTAL --  CKMB --  CKMBINDEX --  TROPONINI <0.30   BNP: No results found for this basename: PROBNP:3 in the last 72 hours D-Dimer: No results found for this basename: DDIMER:2 in the last 72 hours CBG: No results found for this basename: GLUCAP:6 in the last 72 hours Hemoglobin A1C: No results found for this basename: HGBA1C in the last 72 hours Fasting Lipid Panel: No results found for this basename: CHOL,HDL,LDLCALC,TRIG,CHOLHDL,LDLDIRECT in the last 72 hours Thyroid Function Tests: No results found for this basename: TSH,T4TOTAL,FREET4,T3FREE,THYROIDAB in the last 72 hours Anemia Panel: No results found for this basename: VITAMINB12,FOLATE,FERRITIN,TIBC,IRON,RETICCTPCT in the last 72 hours Coagulation: No results found for this basename: LABPROT:2,INR:2 in the last 72 hours Urine Drug Screen: Drugs of Abuse  No results found for this basename: labopia, cocainscrnur, labbenz, amphetmu, thcu, labbarb    Alcohol Level: No results found for this basename: ETH:2 in the last 72 hours Urinalysis:  Basename 01/15/12 1400  COLORURINE YELLOW  LABSPEC 1.015  PHURINE 6.0  GLUCOSEU NEGATIVE  HGBUR TRACE*  BILIRUBINUR NEGATIVE  KETONESUR NEGATIVE  PROTEINUR NEGATIVE  UROBILINOGEN 0.2  NITRITE POSITIVE*  LEUKOCYTESUR NEGATIVE   Misc. Labs:   Micro: Recent Results (from the past 240 hour(s))  URINE CULTURE     Status: Normal (Preliminary result)   Collection Time   01/15/12  2:00 PM      Component Value Range Status Comment   Specimen Description URINE, CLEAN CATCH   Final    Special Requests NONE   Final    Culture  Setup Time 01/15/2012 15:16   Final    Colony Count >=100,000 COLONIES/ML   Final    Culture ESCHERICHIA COLI   Final    Report Status PENDING   Incomplete     Studies/Results: Ct Head Wo Contrast  01/15/2012  *RADIOLOGY  REPORT*  Clinical Data: Dizziness.  Tightness in the face.  CT HEAD WITHOUT CONTRAST  Technique:  Contiguous axial images were obtained from the base of the skull through the vertex without contrast.  Comparison: None.  Findings: Mild generalized atrophy is commensurate with age.  No acute cortical infarct, hemorrhage, or mass lesion is present. Hypoattenuation in the right paracentral brain stem is felt be artifactual.  The ventricles are normal size.  No significant extra- axial fluid collection is present.  A polyp or mucous retention cyst is noted anteriorly within the right maxillary sinus.  The paranasal sinuses and mastoid air cells are clear.  Atherosclerotic calcifications are present within the cavernous carotid arteries and the vertebral arteries bilaterally.  IMPRESSION:  1.  Hypoattenuation along the right-sided brainstem is felt be artifactual. 2.  No other acute intracranial abnormality. 3.  Mild atrophy is within normal limits for age.   Original Report Authenticated By: Marin Roberts, M.D.     Medications:  Scheduled:   . [COMPLETED] amiodarone  150 mg Intravenous Once  . benazepril  10 mg Oral Daily  . carvedilol  3.125 mg Oral BID WC  . [COMPLETED] cefTRIAXone (ROCEPHIN)  IV  1 g Intravenous Once  . cefTRIAXone (ROCEPHIN)  IV  1 g Intravenous Q24H  . enoxaparin (LOVENOX) injection  40 mg Subcutaneous Q24H  . fluticasone  1 spray Each Nare Daily  . pneumococcal 23 valent vaccine  0.5 mL Intramuscular Tomorrow-1000  . polyethylene glycol  17 g Oral Daily  . primidone  75 mg Oral BID  . [COMPLETED] sodium chloride  500 mL Intravenous Once  . sodium chloride  3 mL Intravenous Q12H  . spironolactone  25 mg Oral Daily  . Travoprost (BAK Free)  1 drop Both Eyes QHS  . [DISCONTINUED] amiodarone  100 mg Oral Custom  . [DISCONTINUED] amiodarone  200 mg Oral Custom  . [DISCONTINUED] loratadine  10 mg Oral Daily   Continuous:   . amiodarone (NEXTERONE PREMIX) 360 mg/200 mL  dextrose 60 mg/hr (01/16/12 1017)  . [DISCONTINUED] sodium chloride 125 mL/hr at 01/15/12 1733  . [DISCONTINUED] amiodarone (NEXTERONE PREMIX) 360 mg/200 mL dextrose     ZOX:WRUEAV chloride, acetaminophen, bisacodyl, naproxen, ondansetron (ZOFRAN) IV, sodium chloride, [EXPIRED] sodium phosphate, traZODone, [DISCONTINUED] naproxen sodium  Assessment: Principal Problem:  *UTI (urinary tract infection) Active Problems:  Paroxysmal ventricular tachycardia  Chronic systolic heart failure  AUTOMATIC IMPLANTABLE CARDIAC DEFIBRILLATOR SITU  Hyperlipidemia  Dizziness  Chronic sinusitis  Constipation  HTN (hypertension)   1. Urinary tract infection. Her urine culture is growing Escherichia coli, sensitivities pending. We'll continue Rocephin.  Ventricular tachycardia with a history of AICD/pacemaker implantation; status post firing of the AICD. The patient has already been evaluated by cardiology. Their  assessment and management recommendations noted and appreciated. The patient is being loaded with an amiodarone drip. Her ICD will be interrogated.  Dizziness/lightheadedness. The patient gives no symptomatology consistent with vertigo. Her lightheadedness may be secondary to both the urinary tract infection and ventricular tachycardia.  Chronic systolic congestive heart failure/non-ischemic cardiomyopathy. Her most recent echocardiogram revealed an ejection fraction of 25%. She has no evidence of decompensated congestive heart failure at this time. Will continue Coreg, lisinopril, and Aldactone as ordered.  Hypertension. Currently controlled on the above regimen.  Constipation. MiraLax was ordered.   Plan:  1. Continue management as ordered per cardiology. 2. Continue antibiotics and supportive treatment. 3. We'll check the results of the urine culture sensitivities when available. We'll check the results of the TSH when available.     LOS: 1 day   Dacia Capers 01/16/2012, 11:05  AM

## 2012-01-17 ENCOUNTER — Inpatient Hospital Stay (HOSPITAL_COMMUNITY): Payer: Medicare Other

## 2012-01-17 DIAGNOSIS — Z95810 Presence of automatic (implantable) cardiac defibrillator: Secondary | ICD-10-CM

## 2012-01-17 NOTE — Progress Notes (Signed)
Dr. Sherrie Mustache made aware that patient's HR has been in the 50's most of the day. Coreg increased last night by Dr. Dietrich Pates and administered this morning at 0800 and amio gtt is infusing at 60 mcg/min.  Patient intermittently symptomatic with some dizziness but she emphasizes this is not severe. Patient's BP has remained stable throughout the day. At present, patients HR 65, BP 135/64.

## 2012-01-17 NOTE — Progress Notes (Signed)
Subjective:  Weak and dizzy.   Objective:  Vital Signs in the last 24 hours: Temp:  [97.3 F (36.3 C)-99.1 F (37.3 C)] 97.3 F (36.3 C) (12/04 0800) Pulse Rate:  [55-86] 63  (12/04 0756) Resp:  [17-26] 22  (12/04 0800) BP: (103-134)/(52-84) 134/84 mmHg (12/04 0800) SpO2:  [91 %-94 %] 92 % (12/04 0600)  Intake/Output from previous day: 12/03 0701 - 12/04 0700 In: 530 [P.O.:480; IV Piggyback:50] Out: -  Intake/Output from this shift:    Physical Exam: NECK: Without JVD, HJR, or bruit LUNGS: Decrease breath sounds with few crackles at bases. HEART: Regular rate and rhythm, 2/6 systolic murmur LSB, no gallop, rub, bruit, thrill, or heave EXTREMITIES: Without cyanosis, clubbing, or edema   Lab Results:  [facility] 01/16/12 0529 01/15/12 1342  WBC 8.3 7.1  HGB 12.1 13.3  PLT 271 306    Basename 01/16/12 0529 01/15/12 1342  NA 141 140  K 4.4 4.3  CL 106 101  CO2 28 29  GLUCOSE 75 126*  BUN 21 20  CREATININE 0.68 0.69    Basename 01/15/12 1603  TROPONINI <0.30   Hepatic Function Panel  Basename 01/15/12 2114  PROT 6.6  ALBUMIN 3.4*  AST 19  ALT 18  ALKPHOS 67  BILITOT 0.2*  BILIDIR <0.1  IBILI NOT CALCULATED   No results found for this basename: CHOL in the last 72 hours No results found for this basename: PROTIME in the last 72 hours  BNP 1625  Telemetry: Sinus bradycardia in 50's with runs NSVT Imaging:   Cardiac Studies:  Assessment/Plan:  1. Ventricular Tachycardia: She has ICD-CRT pacemaker [employer] implanted that discharge during episode.  She had 3 successful ATP for VT on 11/30, and 12/2 on ICD interogation. Now on IV Amiodarone 60mg /hr. Continue for another day then switch to po.Cardiac enzymes normal, BNP 1625.I/O's positive.CXR pending Possible related to the stress of UTI infective process.   2. Non-ischemic CM: Most recent echocardiogram reveals EF of 25%. Continue coreg 3.125 mg BID, Lisinopril 10 mg daily, along with  spironolactone.  Normal coronary arteries on cath 2011  3. Hypertension: Blood pressure is well controlled. Continue current regimen.    LOS: 2 days    Jacolyn Reedy 01/17/2012, 8:13 AM  Discussed with Dr Ladona Ridgel.  Reviewede AICD interrogation appropriate D/C with good ATP's and only need for one shock.  VT programmed at 160 with 4 ATP attempts and VF at 210 with one ATP and then shocks.  Continue amiodarone for 48 hours iv and if UTI improving place back on PO dose.  Ok to pace at lower rate limit 60.  Looks dry today despite BNP elevation Laying flat with no dyspnea.  Follow clincially and I/O's no lasix today  Charlton Haws

## 2012-01-17 NOTE — Progress Notes (Signed)
Subjective: The patient has no complaints of chest pain or chest congestion. No firing of the ICD. Overall, she still feels a little weak, but she says that she did not get much sleep last night.  Objective: Vital signs in last 24 hours: Filed Vitals:   01/17/12 0600 01/17/12 0700 01/17/12 0756 01/17/12 0800  BP: 103/52 135/65 124/61 134/84  Pulse: 55 65 63   Temp:    97.3 F (36.3 C)  TempSrc:    Oral  Resp: 17 25  22   Height:      Weight:      SpO2: 92% 95%      Intake/Output Summary (Last 24 hours) at 01/17/12 1012 Last data filed at 01/17/12 0401  Gross per 24 hour  Intake    290 ml  Output      0 ml  Net    290 ml    Weight change:   Physical exam: General: Pleasant 78 year old Caucasian lady lying in bed, in no acute distress. Lungs: Clear to auscultation bilaterally. Heart: S1, S2, with occasional ectopy. Abdomen: Positive bowel sounds, soft, nontender, nondistended. Extremities: No pedal edema. Neurologic: She is alert and oriented x2. Cranial nerves II through XII are intact.    Lab Results: Basic Metabolic Panel:  Basename 01/16/12 0529 01/15/12 2114 01/15/12 1342  NA 141 -- 140  K 4.4 -- 4.3  CL 106 -- 101  CO2 28 -- 29  GLUCOSE 75 -- 126*  BUN 21 -- 20  CREATININE 0.68 -- 0.69  CALCIUM 9.0 -- 9.5  MG -- 1.9 --  PHOS -- -- --   Liver Function Tests:  [facility] 01/15/12 2114  AST 19  ALT 18  ALKPHOS 67  BILITOT 0.2*  PROT 6.6  ALBUMIN 3.4*   No results found for this basename: LIPASE:2,AMYLASE:2 in the last 72 hours No results found for this basename: AMMONIA:2 in the last 72 hours CBC:  Basename 01/16/12 0529 01/15/12 1342  WBC 8.3 7.1  NEUTROABS -- 4.6  HGB 12.1 13.3  HCT 36.6 40.1  MCV 93.1 93.3  PLT 271 306   Cardiac Enzymes:  Basename 01/15/12 1603  CKTOTAL --  CKMB --  CKMBINDEX --  TROPONINI <0.30   BNP:  Basename 01/16/12 2130  PROBNP 1625.0*   D-Dimer: No results found for this basename: DDIMER:2 in the last 72  hours CBG: No results found for this basename: GLUCAP:6 in the last 72 hours Hemoglobin A1C: No results found for this basename: HGBA1C in the last 72 hours Fasting Lipid Panel: No results found for this basename: CHOL,HDL,LDLCALC,TRIG,CHOLHDL,LDLDIRECT in the last 72 hours Thyroid Function Tests:  [facility] 01/15/12 2114  TSH 1.504  T4TOTAL --  FREET4 --  T3FREE --  THYROIDAB --   Anemia Panel: No results found for this basename: VITAMINB12,FOLATE,FERRITIN,TIBC,IRON,RETICCTPCT in the last 72 hours Coagulation: No results found for this basename: LABPROT:2,INR:2 in the last 72 hours Urine Drug Screen: Drugs of Abuse  No results found for this basename: labopia,  cocainscrnur,  labbenz,  amphetmu,  thcu,  labbarb    Alcohol Level: No results found for this basename: ETH:2 in the last 72 hours Urinalysis:  Basename 01/15/12 1400  COLORURINE YELLOW  LABSPEC 1.015  PHURINE 6.0  GLUCOSEU NEGATIVE  HGBUR TRACE*  BILIRUBINUR NEGATIVE  KETONESUR NEGATIVE  PROTEINUR NEGATIVE  UROBILINOGEN 0.2  NITRITE POSITIVE*  LEUKOCYTESUR NEGATIVE   Misc. Labs:   Micro: Recent Results (from the past 240 hour(s))  URINE CULTURE     Status:  Normal   Collection Time   01/15/12  2:00 PM      Component Value Range Status Comment   Specimen Description URINE, CLEAN CATCH   Final    Special Requests NONE   Final    Culture  Setup Time 01/15/2012 15:16   Final    Colony Count >=100,000 COLONIES/ML   Final    Culture ESCHERICHIA COLI   Final    Report Status 01/16/2012 FINAL   Final    Organism ID, Bacteria ESCHERICHIA COLI   Final   MRSA PCR SCREENING     Status: Normal   Collection Time   01/16/12 11:00 AM      Component Value Range Status Comment   MRSA by PCR NEGATIVE  NEGATIVE Final     Studies/Results: Ct Head Wo Contrast  01/15/2012  *RADIOLOGY REPORT*  Clinical Data: Dizziness.  Tightness in the face.  CT HEAD WITHOUT CONTRAST  Technique:  Contiguous axial images were obtained  from the base of the skull through the vertex without contrast.  Comparison: None.  Findings: Mild generalized atrophy is commensurate with age.  No acute cortical infarct, hemorrhage, or mass lesion is present. Hypoattenuation in the right paracentral brain stem is felt be artifactual.  The ventricles are normal size.  No significant extra- axial fluid collection is present.  A polyp or mucous retention cyst is noted anteriorly within the right maxillary sinus.  The paranasal sinuses and mastoid air cells are clear.  Atherosclerotic calcifications are present within the cavernous carotid arteries and the vertebral arteries bilaterally.  IMPRESSION:  1.  Hypoattenuation along the right-sided brainstem is felt be artifactual. 2.  No other acute intracranial abnormality. 3.  Mild atrophy is within normal limits for age.   Original Report Authenticated By: Marin Roberts, M.D.     Medications:  Scheduled:    . [COMPLETED] amiodarone  150 mg Intravenous Once  . benazepril  10 mg Oral Daily  . carvedilol  6.25 mg Oral BID WC  . cefTRIAXone (ROCEPHIN)  IV  1 g Intravenous Q24H  . enoxaparin (LOVENOX) injection  40 mg Subcutaneous Q24H  . fluticasone  1 spray Each Nare Daily  . [COMPLETED] pneumococcal 23 valent vaccine  0.5 mL Intramuscular Tomorrow-1000  . polyethylene glycol  17 g Oral Daily  . primidone  75 mg Oral BID  . sodium chloride  3 mL Intravenous Q12H  . spironolactone  25 mg Oral Daily  . Travoprost (BAK Free)  1 drop Both Eyes QHS  . [DISCONTINUED] carvedilol  3.125 mg Oral BID WC   Continuous:    . amiodarone (NEXTERONE PREMIX) 360 mg/200 mL dextrose 60 mg/hr (01/17/12 0643)   AVW:UJWJXB chloride, acetaminophen, bisacodyl, naproxen, ondansetron (ZOFRAN) IV, sodium chloride, traZODone  Assessment: Principal Problem:  *UTI (urinary tract infection) Active Problems:  Paroxysmal ventricular tachycardia  Nonischemic cardiomyopathy  Automatic implantable cardiac  defibrillator in situ  Hyperlipidemia  Dizziness  Chronic sinusitis  HTN (hypertension)  Degenerative joint disease   1. Urinary tract infection. Her urine culture is growing Escherichia coli, sensitive to ceftriaxone. We'll continue Rocephin.  Ventricular tachycardia with a history of AICD/pacemaker implantation; status post firing of the AICD. The patient is being maintained on the amiodarone drip per cardiology. Status post ICD interrogation. Her TSH is within normal limits.  Dizziness/lightheadedness. The patient gives no symptomatology consistent with vertigo. Her lightheadedness may be secondary to both the urinary tract infection and ventricular tachycardia.  Chronic systolic congestive heart failure/non-ischemic cardiomyopathy. Her most recent  echocardiogram revealed an ejection fraction of 25%. She has no evidence of decompensated congestive heart failure at this time, although her pro- BNP is elevated. Will continue Coreg, lisinopril, and Aldactone as ordered.  Hypertension. Currently controlled on the above regimen.  Constipation. MiraLax was ordered.   Plan:  1. Continue current management. 2. Further recommendations per cardiology regarding amiodarone treatment. 3. PT consultation when she is off of the amiodarone drip. 4. Check laboratory studies in the morning.    LOS: 2 days   Yasuo Phimmasone 01/17/2012, 10:12 AM

## 2012-01-18 ENCOUNTER — Other Ambulatory Visit: Payer: Self-pay

## 2012-01-18 ENCOUNTER — Encounter (HOSPITAL_COMMUNITY): Payer: Self-pay | Admitting: Internal Medicine

## 2012-01-18 DIAGNOSIS — M25559 Pain in unspecified hip: Secondary | ICD-10-CM

## 2012-01-18 LAB — BASIC METABOLIC PANEL
CO2: 31 mEq/L (ref 19–32)
Calcium: 9 mg/dL (ref 8.4–10.5)
Chloride: 101 mEq/L (ref 96–112)
Creatinine, Ser: 0.71 mg/dL (ref 0.50–1.10)
Glucose, Bld: 87 mg/dL (ref 70–99)
Sodium: 140 mEq/L (ref 135–145)

## 2012-01-18 MED ORDER — AMIODARONE HCL 200 MG PO TABS
400.0000 mg | ORAL_TABLET | Freq: Two times a day (BID) | ORAL | Status: DC
  Administered 2012-01-18 – 2012-01-19 (×3): 400 mg via ORAL
  Filled 2012-01-18 (×4): qty 2

## 2012-01-18 MED ORDER — DICLOFENAC SODIUM 1 % TD GEL
2.0000 g | Freq: Three times a day (TID) | TRANSDERMAL | Status: DC
  Filled 2012-01-18: qty 100

## 2012-01-18 MED ORDER — AMIODARONE HCL 200 MG PO TABS: 200.0000 mg | ORAL_TABLET | Freq: Two times a day (BID) | ORAL | Status: DC

## 2012-01-18 NOTE — Progress Notes (Signed)
Subjective: No SOB  No CP Objective: Filed Vitals:   01/18/12 0830 01/18/12 0835 01/18/12 0840 01/18/12 0845  BP: 116/75 120/76 112/64   Pulse:    70  Temp:      TempSrc:      Resp: 21 21 22    Height:      Weight:      SpO2:       Weight change:   Intake/Output Summary (Last 24 hours) at 01/18/12 0912 Last data filed at 01/17/12 1800  Gross per 24 hour  Intake 317.33 ml  Output      0 ml  Net 317.33 ml    General: Alert, awake, oriented x3, in no acute distress Neck:  JVP is normal Heart: Regular rate and rhythm, without murmurs, rubs, gallops.  Lungs: Clear to auscultation.  No rales or wheezes. Exemities:  No edema.   Neuro: Grossly intact, nonfocal.   Lab Results: Results for orders placed during the hospital encounter of 01/15/12 (from the past 24 hour(s))  BASIC METABOLIC PANEL     Status: Abnormal   Collection Time   01/18/12  4:23 AM      Component Value Range   Sodium 140  135 - 145 mEq/L   Potassium 4.0  3.5 - 5.1 mEq/L   Chloride 101  96 - 112 mEq/L   CO2 31  19 - 32 mEq/L   Glucose, Bld 87  70 - 99 mg/dL   BUN 14  6 - 23 mg/dL   Creatinine, Ser 1.61  0.50 - 1.10 mg/dL   Calcium 9.0  8.4 - 09.6 mg/dL   GFR calc non Af Amer 76 (*) >90 mL/min   GFR calc Af Amer 88 (*) >90 mL/min    Studies/Results: @RISRSLT24 @  Medications:REviewed  CXR::  12/4  Cardiomegaly, no pulm edema   Patient Active Hospital Problem List:  1.  Ventricular Tacycardia.  No further VT.  Would switch to PO amiodarone.  Keep on 400 bid while in hospital.  Switch to 200 bid at home.    2.  NICM. Volume status looks good  Keep on current regimen  May tolerate increase in Coreg but has been a liittle unsteady on feet  Follow  3.  HTN  Adequate control  Ambulate today with assist.  LOS: 3 days   Dietrich Pates 01/18/2012, 9:12 AM

## 2012-01-18 NOTE — Care Management Note (Signed)
    Page 1 of 2   01/19/2012     10:55:19 AM   CARE MANAGEMENT NOTE 01/19/2012  Patient:  [name], [name]   Account Number:  [id number]  Date Initiated:  01/18/2012  Documentation initiated by:  Sharrie Rothman  Subjective/Objective Assessment:   Pt admitted from home with dizziness and UTI. Pt lives alone but has a son and daughter in law who checks on pt frequently and provides transportation to MD appts. Pt also has a church friend that checks on her daily and brings her food.     Action/Plan:   Pt has cane and walker. Pt will need HH PT at discharge. Would also benefit from RN at discharge.   Anticipated DC Date:  01/20/2012   Anticipated DC Plan:  HOME W HOME HEALTH SERVICES      DC Planning Services  CM consult      [facility] Choice  HOME HEALTH   Choice offered to / List presented to:  C-1 Patient        HH arranged  HH-1 RN  HH-2 PT      [facility] agency  [facility]   Status of service:  Completed, signed off Medicare Important Message given?  YES (If response is "NO", the following Medicare IM given date fields will be blank) Date Medicare IM given:  01/19/2012 Date Additional Medicare IM given:    Discharge Disposition:  HOME W HOME HEALTH SERVICES  Per UR Regulation:    If discussed at Long Length of Stay Meetings, dates discussed:    Comments:  01/19/12 1051 Arlyss Queen, RN BSN CM Pt discharged home today with [facility] RN and PT. HH set up with [facility] and they will see pt on Monday December 9,2013. Pt and pts nurse aware of discharge arrangements. No DME needs noted. 01/18/12 1600 Arlyss Queen, RN BSN CM

## 2012-01-18 NOTE — Progress Notes (Addendum)
SUBJECTIVE: Feels much better, but is complaining of left hip pain. Dizziness is chronic, but improved.  Principal Problem:  *UTI (urinary tract infection) Active Problems:  Paroxysmal ventricular tachycardia  Nonischemic cardiomyopathy  Automatic implantable cardiac defibrillator in situ  Hyperlipidemia  Dizziness  Chronic sinusitis  HTN (hypertension)  Degenerative joint disease  Hip pain, right   LABS: Basic Metabolic Panel:  Basename 01/18/12 0423 01/16/12 0529 01/15/12 2114  NA 140 141 --  K 4.0 4.4 --  CL 101 106 --  CO2 31 28 --  GLUCOSE 87 75 --  BUN 14 21 --  CREATININE 0.71 0.68 --  CALCIUM 9.0 9.0 --  MG -- -- 1.9  PHOS -- -- --   Liver Function Tests:  [facility] 01/15/12 2114  AST 19  ALT 18  ALKPHOS 67  BILITOT 0.2*  PROT 6.6  ALBUMIN 3.4*   No results found for this basename: LIPASE:2,AMYLASE:2 in the last 72 hours CBC:  Basename 01/16/12 0529 01/15/12 1342  WBC 8.3 7.1  NEUTROABS -- 4.6  HGB 12.1 13.3  HCT 36.6 40.1  MCV 93.1 93.3  PLT 271 306   Cardiac Enzymes:  Basename 01/15/12 1603  CKTOTAL --  CKMB --  CKMBINDEX --  TROPONINI <0.30     Thyroid Function Tests:  Basename 01/15/12 2114  TSH 1.504  T4TOTAL --  T3FREE --  THYROIDAB --    RADIOLOGY: Dg Chest Port 1 View  01/17/2012  *RADIOLOGY REPORT*  Clinical Data: Cardiomyopathy.  Dizziness.  PORTABLE CHEST - 1 VIEW  Comparison: Chest x-rays dated 12/01/2009 12/24/2007  Findings: Transvenous defibrillator in place.  Chronic cardiomegaly.  Pulmonary vascularity is normal.  No acute infiltrates or effusions.  Chronic elevation of the right hemidiaphragm.  No acute osseous abnormality.  IMPRESSION: No acute abnormalities.  Chronic cardiomegaly.   Original Report Authenticated By: Francene Boyers, M.D.      PHYSICAL EXAM BP 112/64  Pulse 70  Temp 97.6 F (36.4 C) (Axillary)  Resp 22  Ht 5' (1.524 m)  Wt 128 lb 12 oz (58.4 kg)  BMI 25.14 kg/m2  SpO2 96% General: Well  developed, well nourished, in no acute distress Head: Eyes PERRLA, No xanthomas.   Normal cephalic and atramatic  Lungs: Clear bilaterally to auscultation and percussion. Heart: HRIR S1 S2, No MRG .  Pulses are 2+ & equal.            No carotid bruit. No JVD.  No abdominal bruits. No femoral bruits. Abdomen: Bowel sounds are positive, abdomen soft and non-tender without masses or                  Hernia's noted. Msk:  Back normal, normal gait. Normal strength and tone for age. Extremities: No clubbing, cyanosis or edema.  DP +1 Neuro: Alert and oriented X 3. Psych:  Good affect, responds appropriately  TELEMETRY: Reviewed telemetry pt in: Paced rhythm with PAC's.   ASSESSMENT AND PLAN:  1. 1. Ventricular Tachycardia: She has ICD-CRT pacemaker [employer] implanted that discharge during episode. She had 3 successful ATP for VT on 11/30, and 12/2 on ICD interogation. Now on IV Amiodarone 60mg /hr. Will discontinue IV and start her on po 400 mg BID. While here, 200 bid at home.  Episode most likely related to UTI and infective process. 2. Non-ischemic CM: Most recent echocardiogram reveals EF of 25%. Continue coreg 3.125 mg BID, Lisinopril 10 mg daily, along with spironolactone. Normal coronary arteries on cath 2011. Lasix help yesterday by Dr.  Nishan. She has had improvement in her dizziness. BNP elevated, but no evidence of CHF per CXR or on clinical assessment.  3. Hypertension: Blood pressure is well controlled. Continue current regimen   Bettey Mare. Lyman Bishop NP [facility] 01/18/2012, 9:19 AM  Patient seen and examine.  See accompanying note in note section.

## 2012-01-18 NOTE — Evaluation (Signed)
Physical Therapy Evaluation Patient Details Name: [name] MRN: [id number] DOB: 1923-10-14 Today's Date: 01/18/2012 Time: 4098-1191 PT Time Calculation (min): 36 min  PT Assessment / Plan / Recommendation Clinical Impression  Pt is a pleasant 78 year old female referred  to PT for general mobility secondary to UTI.  At this time recommend HHPT and 24/7 supervision as pt has she has decreased activity tolerance and requires min A-CGA for all mobility at the current time and has reports of lightheadness during mobilty activities.     PT Assessment  Patient needs continued PT services    Follow Up Recommendations  Home health PT;Supervision/Assistance - 24 hour    Does the patient have the potential to tolerate intense rehabilitation      Barriers to Discharge        Equipment Recommendations  None recommended by PT    Recommendations for Other Services     Frequency Min 3X/week    Precautions / Restrictions     Pertinent Vitals/Pain Pain: FACES 4/10 to R hip      Mobility  Bed Mobility Bed Mobility: Supine to Sit Supine to Sit: 4: Min assist Transfers Transfers: Sit to Stand;Stand to Sit Sit to Stand: 4: Min assist;With upper extremity assist;From bed;From toilet Stand to Sit: 5: Supervision;To toilet;To bed Details for Transfer Assistance: pt reports light headness with sitting EOB.  Ambulation/Gait Ambulation/Gait Assistance: 4: Min guard Ambulation Distance (Feet): 65 Feet Assistive device: Rolling walker Gait Pattern: Trunk flexed;Decreased stride length;Decreased weight shift to right Gait velocity: decreased General Gait Details: pt reports lightheadness during gait, able to continue but requires standing rest breaks.     Shoulder Instructions     Exercises     PT Diagnosis: Difficulty walking;Abnormality of gait;Generalized weakness  PT Problem List: Decreased strength;Decreased activity tolerance;Decreased balance;Pain PT Treatment  Interventions: Gait training;Stair training;Functional mobility training;Therapeutic activities;Therapeutic exercise;Balance training   PT Goals Acute Rehab PT Goals PT Goal Formulation: With patient Pt will go Supine/Side to Sit: with supervision PT Goal: Supine/Side to Sit - Progress: Goal set today Pt will go Sit to Supine/Side: with supervision PT Goal: Sit to Supine/Side - Progress: Goal set today Pt will go Sit to Stand: with supervision Pt will go Stand to Sit: with supervision PT Goal: Stand to Sit - Progress: Goal set today Pt will Ambulate: >150 feet;with least restrictive assistive device;with supervision PT Goal: Ambulate - Progress: Goal set today Pt will Go Up / Down Stairs: 3-5 stairs;with min assist;with least restrictive assistive device (w/rail) PT Goal: Up/Down Stairs - Progress: Goal set today  Visit Information  Last PT Received On: 01/18/12    Subjective Data  Subjective: My right hip hurts.  They gave me ibuprofen to help with the pain.    Prior Functioning  Home Living Type of Home: House Home Access: Stairs to enter Entrance Stairs-Number of Steps: 2 Entrance Stairs-Rails: Right Home Layout: Two level Alternate Level Stairs-Number of Steps: 5 steps and 11 steps Bathroom Shower/Tub: Tub/shower unit;Walk-in shower Home Adaptive Equipment: Walker - rolling;Straight cane;Shower chair without back;Grab bars in shower;Hospital bed Additional Comments: hospital bed, but she sleeps on couch because bed is uncomfortable Prior Function Level of Independence: Independent with assistive device(s) Able to Take Stairs?: Yes Driving: Yes 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

## 2012-01-18 NOTE — Progress Notes (Addendum)
At 1010, po dose of amiodarone 400 mg given per MD order. At 1110, will turn amiodarone drip off.  Will continue to monitor.

## 2012-01-18 NOTE — Progress Notes (Addendum)
Subjective:  She slept better last night. No firing of the ICD. Overall, she still  feels a little dizzy when she stands. She complains of right hip pain, but this is not new but chronic.  Objective: Vital signs in last 24 hours: Filed Vitals:   01/18/12 0830 01/18/12 0835 01/18/12 0840 01/18/12 0845  BP: 116/75 120/76 112/64   Pulse:    70  Temp:      TempSrc:      Resp: 21 21 22    Height:      Weight:      SpO2:        Intake/Output Summary (Last 24 hours) at 01/18/12 1156 Last data filed at 01/18/12 1048  Gross per 24 hour  Intake 250.73 ml  Output    100 ml  Net 150.73 ml    Weight change:   Physical exam: General: Pleasant 78 year old Caucasian lady lying in bed, in no acute distress. Lungs: Clear to auscultation bilaterally. Heart: S1, S2, with occasional ectopy. Abdomen: Positive bowel sounds, soft, nontender, nondistended. Extremities: No pedal edema. Neurologic: She is alert and oriented x2. Cranial nerves II through XII are intact.    Lab Results: Basic Metabolic Panel:  Basename 01/18/12 0423 01/16/12 0529 01/15/12 2114  NA 140 141 --  K 4.0 4.4 --  CL 101 106 --  CO2 31 28 --  GLUCOSE 87 75 --  BUN 14 21 --  CREATININE 0.71 0.68 --  CALCIUM 9.0 9.0 --  MG -- -- 1.9  PHOS -- -- --   Liver Function Tests:  [facility] 01/15/12 2114  AST 19  ALT 18  ALKPHOS 67  BILITOT 0.2*  PROT 6.6  ALBUMIN 3.4*   No results found for this basename: LIPASE:2,AMYLASE:2 in the last 72 hours No results found for this basename: AMMONIA:2 in the last 72 hours CBC:  Basename 01/16/12 0529 01/15/12 1342  WBC 8.3 7.1  NEUTROABS -- 4.6  HGB 12.1 13.3  HCT 36.6 40.1  MCV 93.1 93.3  PLT 271 306   Cardiac Enzymes:  Basename 01/15/12 1603  CKTOTAL --  CKMB --  CKMBINDEX --  TROPONINI <0.30   BNP:  Basename 01/16/12 2130  PROBNP 1625.0*   D-Dimer: No results found for this basename: DDIMER:2 in the last 72 hours CBG: No results found for this  basename: GLUCAP:6 in the last 72 hours Hemoglobin A1C: No results found for this basename: HGBA1C in the last 72 hours Fasting Lipid Panel: No results found for this basename: CHOL,HDL,LDLCALC,TRIG,CHOLHDL,LDLDIRECT in the last 72 hours Thyroid Function Tests:  [facility] 01/15/12 2114  TSH 1.504  T4TOTAL --  FREET4 --  T3FREE --  THYROIDAB --   Anemia Panel: No results found for this basename: VITAMINB12,FOLATE,FERRITIN,TIBC,IRON,RETICCTPCT in the last 72 hours Coagulation: No results found for this basename: LABPROT:2,INR:2 in the last 72 hours Urine Drug Screen: Drugs of Abuse  No results found for this basename: labopia,  cocainscrnur,  labbenz,  amphetmu,  thcu,  labbarb    Alcohol Level: No results found for this basename: ETH:2 in the last 72 hours Urinalysis:  Basename 01/15/12 1400  COLORURINE YELLOW  LABSPEC 1.015  PHURINE 6.0  GLUCOSEU NEGATIVE  HGBUR TRACE*  BILIRUBINUR NEGATIVE  KETONESUR NEGATIVE  PROTEINUR NEGATIVE  UROBILINOGEN 0.2  NITRITE POSITIVE*  LEUKOCYTESUR NEGATIVE   Misc. Labs:   Micro: Recent Results (from the past 240 hour(s))  URINE CULTURE     Status: Normal   Collection Time   01/15/12  2:00 PM  Component Value Range Status Comment   Specimen Description URINE, CLEAN CATCH   Final    Special Requests NONE   Final    Culture  Setup Time 01/15/2012 15:16   Final    Colony Count >=100,000 COLONIES/ML   Final    Culture ESCHERICHIA COLI   Final    Report Status 01/16/2012 FINAL   Final    Organism ID, Bacteria ESCHERICHIA COLI   Final   MRSA PCR SCREENING     Status: Normal   Collection Time   01/16/12 11:00 AM      Component Value Range Status Comment   MRSA by PCR NEGATIVE  NEGATIVE Final     Studies/Results: Dg Chest Port 1 View  01/17/2012  *RADIOLOGY REPORT*  Clinical Data: Cardiomyopathy.  Dizziness.  PORTABLE CHEST - 1 VIEW  Comparison: Chest x-rays dated 12/01/2009 12/24/2007  Findings: Transvenous defibrillator in  place.  Chronic cardiomegaly.  Pulmonary vascularity is normal.  No acute infiltrates or effusions.  Chronic elevation of the right hemidiaphragm.  No acute osseous abnormality.  IMPRESSION: No acute abnormalities.  Chronic cardiomegaly.   Original Report Authenticated By: Francene Boyers, M.D.     Medications:  Scheduled:    . amiodarone  400 mg Oral BID  . benazepril  10 mg Oral Daily  . carvedilol  6.25 mg Oral BID WC  . cefTRIAXone (ROCEPHIN)  IV  1 g Intravenous Q24H  . diclofenac sodium  2 g Topical TID  . enoxaparin (LOVENOX) injection  40 mg Subcutaneous Q24H  . fluticasone  1 spray Each Nare Daily  . polyethylene glycol  17 g Oral Daily  . primidone  75 mg Oral BID  . sodium chloride  3 mL Intravenous Q12H  . spironolactone  25 mg Oral Daily  . Travoprost (BAK Free)  1 drop Both Eyes QHS  . [DISCONTINUED] amiodarone  200 mg Oral BID   Continuous:    . [DISCONTINUED] amiodarone (NEXTERONE PREMIX) 360 mg/200 mL dextrose 30 mg/hr (01/18/12 0600)   UUV:OZDGUY chloride, acetaminophen, bisacodyl, naproxen, ondansetron (ZOFRAN) IV, sodium chloride, traZODone  Assessment: Principal Problem:  *UTI (urinary tract infection) Active Problems:  Paroxysmal ventricular tachycardia  Nonischemic cardiomyopathy  Automatic implantable cardiac defibrillator in situ  Hyperlipidemia  Dizziness  Chronic sinusitis  HTN (hypertension)  Degenerative joint disease  Hip pain, right   1. Urinary tract infection. Her urine culture is growing Escherichia coli, sensitive to ceftriaxone. We'll continue Rocephin.  Ventricular tachycardia with a history of AICD/pacemaker implantation; status post firing of the AICD. The patient is status post amiodarone loading  And now amiodarone  is on oral per cardiology. Status post ICD interrogation. Her TSH is within normal limits.  Dizziness/lightheadedness. The patient gives no symptomatology consistent with vertigo. Her lightheadedness may be secondary to  both the urinary tract infection and ventricular tachycardia or neither. She is not particularly orthostatic per orthostatic vital signs. Lasix was held by cardiology.   Chronic systolic congestive heart failure/non-ischemic cardiomyopathy. Her most recent echocardiogram revealed an ejection fraction of 25%. She has no evidence of decompensated congestive heart failure at this time, although her pro- BNP is elevated. Will continue Coreg, lisinopril, and Aldactone as ordered.  Hypertension. Currently controlled on the above regimen.  Constipation. MiraLax was ordered.  Chronic right hip pain. This is likely secondary to degenerative joint disease.    Plan:  1. Increase activity. PT consultation.  2. Further recommendations per cardiology. 3. Add topical Voltaren gel for hip pain. 4. Hopeful discharge  soon.     LOS: 3 days   Kayman Snuffer 01/18/2012, 11:56 AM

## 2012-01-19 MED ORDER — DICLOFENAC SODIUM 1 % TD GEL: 2.0000 g | Freq: Three times a day (TID) | TRANSDERMAL | Status: DC | PRN

## 2012-01-19 MED ORDER — AMIODARONE HCL 200 MG PO TABS: 200.0000 mg | ORAL_TABLET | Freq: Two times a day (BID) | ORAL | Status: DC

## 2012-01-19 MED ORDER — POLYETHYLENE GLYCOL 3350 17 G PO PACK: 17.0000 g | PACK | Freq: Every day | ORAL | Status: DC | PRN

## 2012-01-19 MED ORDER — CEFUROXIME AXETIL 500 MG PO TABS: 500.0000 mg | ORAL_TABLET | Freq: Two times a day (BID) | ORAL | Status: DC

## 2012-01-19 MED ORDER — FLUTICASONE PROPIONATE 50 MCG/ACT NA SUSP: 1.0000 | Freq: Every day | NASAL | Status: DC

## 2012-01-19 NOTE — Progress Notes (Signed)
Patient given discharge instructions, follow up appointments and prescriptions. Patient verbalizes understanding of all instructions. Patient alert, oriented and in stable condition at the time of discharge. Patient being discharged home with son.

## 2012-01-19 NOTE — Progress Notes (Signed)
SUBJECTIVE:Wants to go home. No complaints at this time. Working with PT.  Principal Problem:  *UTI (urinary tract infection) Active Problems:  Paroxysmal ventricular tachycardia  Nonischemic cardiomyopathy  Automatic implantable cardiac defibrillator in situ  Hyperlipidemia  Dizziness  Chronic sinusitis  HTN (hypertension)  Degenerative joint disease  Hip pain, right   LABS: Basic Metabolic Panel:  Basename 01/18/12 0423  NA 140  K 4.0  CL 101  CO2 31  GLUCOSE 87  BUN 14  CREATININE 0.71  CALCIUM 9.0  MG --  PHOS --    RADIOLOGY: Dg Chest Port 1 View  01/17/2012  *RADIOLOGY REPORT*  Clinical Data: Cardiomyopathy.  Dizziness.  PORTABLE CHEST - 1 VIEW  Comparison: Chest x-rays dated 12/01/2009 12/24/2007  Findings: Transvenous defibrillator in place.  Chronic cardiomegaly.  Pulmonary vascularity is normal.  No acute infiltrates or effusions.  Chronic elevation of the right hemidiaphragm.  No acute osseous abnormality.  IMPRESSION: No acute abnormalities.  Chronic cardiomegaly.   Original Report Authenticated By: Francene Boyers, M.D.      PHYSICAL EXAM BP 127/63  Pulse 66  Temp 97.3 F (36.3 C) (Axillary)  Resp 22  Ht 5' (1.524 m)  Wt 126 lb 12.2 oz (57.5 kg)  BMI 24.76 kg/m2  SpO2 95% General: Well developed, well nourished, in no acute distress Head: Eyes PERRLA, No xanthomas.   Normal cephalic and atramatic  Lungs: Clear bilaterally to auscultation and percussion. Heart: HRIR S1 S2, No MRG .  Pulses are 2+ & equal.            No carotid bruit. No JVD.  No abdominal bruits. No femoral bruits. Abdomen: Bowel sounds are positive, abdomen soft and non-tender without masses or                  Hernia's noted. Msk:  Back normal, normal gait. Normal strength and tone for age. Extremities: No clubbing, cyanosis or edema.  DP +1 Neuro: Alert and oriented X 3. Psych:  Good affect, responds appropriately  TELEMETRY: Reviewed telemetry pt in: Ventricular Paced rhythm.    ASSESSMENT AND PLAN:  1. Ventricular Tachycardia: She has ICD-CRT pacemaker with no further therapy for 3 days. She has been transitioned to po amiodarone 400 mg BID for the last 24 hours, and will be decreased to 200 mg BID on discharge. She has a follow up appointment scheduled with Dr. Daleen Squibb on Dec 18th, and is to see Dr. Ladona Ridgel in January. UTI symptoms have subsided.  2. Non-ischemic CM: EF of 25%. Continue current medications at this time without other changes.  3. Hypertension: Blood pressure is  Well controled .  OK to go home from cardiology standpoint with quick follow-up.  Bettey Mare. Lyman Bishop NP [facility] 01/19/2012, 9:30 AM  Patient seen and examined  Tired now but breathing is OK NO VT on telemetry  Lungs CTA  Cardiac:  RRR.  No signif murmurs  Ext  No edema   Plan as noted above.  Keep on 200 bid amio.  Appts for f/u made.

## 2012-01-19 NOTE — Progress Notes (Signed)
Physical Therapy Treatment Patient Details Name: [name] MRN: [id number] DOB: 08-10-1923 Today's Date: 01/19/2012 Time: 4098-1191 PT Time Calculation (min): 16 min  PT Assessment / Plan / Recommendation Comments on Treatment Session  Pt not able to ambulate as far today, however has improved gait mobility and speed w/modified independent.    Follow Up Recommendations        Does the patient have the potential to tolerate intense rehabilitation     Barriers to Discharge        Equipment Recommendations       Recommendations for Other Services    Frequency     Plan Discharge plan remains appropriate    Precautions / Restrictions     Pertinent Vitals/Pain Hip stiffness    Mobility  Bed Mobility Bed Mobility: Sit to Supine Supine to Sit: 6: Modified independent (Device/Increase time) Transfers Transfers: Sit to Stand;Stand to Sit Sit to Stand: 6: Modified independent (Device/Increase time) Stand to Sit: 6: Modified independent (Device/Increase time) Ambulation/Gait Ambulation/Gait Assistance: 5: Supervision Ambulation Distance (Feet): 35 Feet Assistive device: Rolling walker Gait Pattern: Step-to pattern;Trunk flexed Gait velocity: improved, still decreased overall    Exercises     PT Diagnosis:    PT Problem List:   PT Treatment Interventions:     PT Goals Acute Rehab PT Goals PT Goal Formulation: With patient Pt will go Supine/Side to Sit: with supervision PT Goal: Supine/Side to Sit - Progress: Progressing toward goal Pt will go Sit to Supine/Side: with supervision PT Goal: Sit to Supine/Side - Progress: Progressing toward goal Pt will go Sit to Stand: with supervision PT Goal: Sit to Stand - Progress: Met Pt will go Stand to Sit: with supervision PT Goal: Stand to Sit - Progress: Met Pt will Ambulate: >150 feet;with least restrictive assistive device;with supervision PT Goal: Ambulate - Progress: Progressing toward goal Pt will Go Up / Down  Stairs: 3-5 stairs;with min assist;with least restrictive assistive device (w/rail) PT Goal: Up/Down Stairs - Progress: Progressing toward goal  Visit Information  Last PT Received On: 01/19/12    Subjective Data  Subjective: Pt reports that she is very tired today from having a bath and sitting up for awhile.  States her hip is feeling better.    Cognition       Balance     End of Session PT - End of Session Equipment Utilized During Treatment: Gait belt Activity Tolerance: Patient tolerated treatment well Patient left: in bed   GP     Brodee Mauritz 01/19/2012, 9:31 AM

## 2012-01-20 ENCOUNTER — Encounter (HOSPITAL_COMMUNITY): Payer: Self-pay | Admitting: Anesthesiology

## 2012-01-20 ENCOUNTER — Encounter (HOSPITAL_COMMUNITY): Payer: Self-pay | Admitting: Internal Medicine

## 2012-01-20 DIAGNOSIS — M25552 Pain in left hip: Secondary | ICD-10-CM | POA: Diagnosis present

## 2012-01-20 NOTE — Anesthesia Preprocedure Evaluation (Deleted)
Anesthesia Evaluation Anesthesia Physical Anesthesia Plan Anesthesia Quick Evaluation   01/20/12  Disregard entry, inadvertantly entered on wrong chart.

## 2012-01-20 NOTE — Discharge Summary (Signed)
Physician Discharge Summary  [name] RUE:[id number] DOB: 08/03/1923 DOA: 01/15/2012  PCP: Harlow Asa, MD  Admit date: 01/15/2012 Discharge date: 01/20/2012  Time spent: Greater than 30 minutes  Recommendations for Outpatient Follow-up:  1. The patient will followup with [place] [facility] as scheduled in 1-1/2 weeks. 2. Home health physical therapy and RN were ordered.  Discharge Diagnoses:  1. Paroxysmal ventricular tachycardia, with  firing of the ICD during hospitalization. Status post amiodarone loading. 2. Status post ICD-CR T-D pacemaker placed in 2003 for complete heart block with ventricular tachycardia. 3. Nonischemic cardiomyopathy with an ejection fraction of 25%. Normal coronary arteries, per cardiac catheterization in 2011. 4. Urinary tract infection, secondary to Escherichia coli. 5. Degenerative joint disease with chronic left hip pain. 6. Lightheadedness (nonvertiginous dizziness), thought to be secondary to ventricular tachycardia and urinary tract infection. 7. Chronic sinusitis, radiographically. 8. Hyperlipidemia. 9. Hypertension.  Discharge Condition: Improved and stable.  Diet recommendation: Heart healthy.  Filed Weights   01/16/12 0644 01/18/12 0500 01/19/12 0519  Weight: 57.153 kg (126 lb) 58.4 kg (128 lb 12 oz) 57.5 kg (126 lb 12.2 oz)    History of present illness:  The patient is an 78 year old woman with a history significant for ICD/pacemaker placement for heart block and ventricular tachycardia and nonischemic cardiomyopathy, who presented to the emergency department on 01/15/2012 with a chief complaint of palpitations and lightheadedness. In the emergency department, she was afebrile and hemodynamically stable. Her EKG revealed ventricular paced rhythm of 87 beats per minute. Her troponin I was within normal limits. Her electrolytes were within normal limits. Her CBC was within normal limits. Her urinalysis revealed nitrites and 21-50 WBCs. CT  scan of her head revealed mild atrophy but no acute changes. Her chest x-ray revealed chronic cardiomegaly, but no acute disease. She was admitted for further evaluation and management.  Hospital Course:  The patient was started on IV Rocephin for treatment of the urinary tract infection. The urine culture was ordered. [place] cardiology was consulted for evaluation of the patient's ICD/pacemaker. It was believed that the patient's lightheadedness may have been a consequence of both intermittent arrhythmias in the urinary tract infection. During the first 24 hours of the hospitalization, the patient's ICD discharged. She felt a small amount of chest pain. Dr. Dietrich Pates provided the initial evaluation. Nurse practitioner, Ms. Lawrence also evaluated and examined the patient. Following evaluation, the cardiology team conferred with EP physician Dr. Ladona Ridgel in [place]. Following her discussion, the patient was moved to the ICU and amiodarone drip for loading was started. The ICD was also interrogated. The patient was maintained on her chronic cardiac medications including Coreg, lisinopril, and Aldactone. Although her pro BNP was elevated, there was no radiographic or clinical evidence of decompensated congestive heart failure. The patient's TSH was assessed and it was within normal limits.  Following the amiodarone drip/loading, it was discontinued in favor of oral amiodarone. It was eventually titrated down from 400 mg twice a day to 200 mg twice a day at the time of discharge. She remained hemodynamically stable and afebrile. She was still a little lightheaded, but less so. The physical therapist was consulted. She recommended home health physical therapy. This was ordered.  Her urine culture grew out greater than 100,000 colonies of Escherichia coli, sensitive to ceftriaxone. She received 4 and half days of IV antibiotic treatment. She was discharged on 2 more days of Ceftin. She had no pain or discomfort  with urination upon discharge.   Procedures:  ICD interrogation  Consultations:  [place] [facility]ology including Dr. Dietrich Pates, Dr.Nishan, and Dr. Tenny Craw.  Discharge Exam: Filed Vitals:   01/19/12 0800 01/19/12 0854 01/19/12 1000 01/19/12 1100  BP: 127/63  105/50 113/53  Pulse:      Temp:  97.3 F (36.3 C)    TempSrc:  Axillary    Resp: 22  20 20   Height:      Weight:      SpO2:        General: pleasant elderly alert 78 year old Caucasian woman, sitting in bed, in no acute distress. Cardiovascular: S1, S2, with occasional ectopy. Respiratory: breathing nonlabored, lungs clear to auscultation bilaterally. Neurologic: She is alert and oriented x3. Cranial nerves II through XII are intact.  Discharge Instructions  Discharge Orders    Future Appointments: Provider: Department: Dept Phone: Center:   01/31/2012 11:20 AM Dyann Kief, PA [facility] [place] [phone] [facility]   02/01/2012 8:05 AM [facility] Main Office [place]) [phone] [email or web address]     Future Orders Please Complete By Expires   Diet - low sodium heart healthy      Increase activity slowly      Discharge instructions      Comments:   Stand slowly.       Medication List     As of 01/20/2012 11:02 AM    STOP taking these medications         naproxen sodium 220 MG tablet   Commonly known as: ANAPROX      TAKE these medications         amiodarone 200 MG tablet   Commonly known as: PACERONE   Take 1 tablet (200 mg total) by mouth 2 (two) times daily.      benazepril 10 MG tablet   Commonly known as: LOTENSIN   Take 10 mg by mouth daily.      CALCIUM 600 + D PO   Take 1 tablet by mouth daily.      carvedilol 3.125 MG tablet   Commonly known as: COREG   Take 3.125 mg by mouth 2 (two) times daily with a meal.      cefUROXime 500 MG tablet   Commonly known as: CEFTIN   Take 1 tablet (500 mg total) by mouth 2 (two) times daily. Antibiotic  to take for 2 more days.      diclofenac sodium 1 % Gel   Commonly known as: VOLTAREN   Apply 2 g topically 3 (three) times daily as needed.      fluticasone 50 MCG/ACT nasal spray   Commonly known as: FLONASE   Place 1 spray into the nose daily.      multivitamin with minerals Tabs   Take 1 tablet by mouth every morning.      polyethylene glycol packet   Commonly known as: MIRALAX / GLYCOLAX   Take 17 g by mouth daily as needed (Constipation).      primidone 50 MG tablet   Commonly known as: MYSOLINE   Take 75 mg by mouth 2 (two) times daily.      spironolactone 25 MG tablet   Commonly known as: ALDACTONE   Take 25 mg by mouth daily.      Travoprost (BAK Free) 0.004 % Soln ophthalmic solution   Commonly known as: TRAVATAN   Place 1 drop into both eyes at bedtime.           Follow-up Information    Follow up with Jacolyn Reedy, PA. On 01/31/2012. (  11:20 AM )    Contact information:   [address] [place], [place] [phone]       Follow up with Harlow Asa, MD. On 01/22/2012. (10AM)    Contact information:   [address] [place] [address] [phone]           The results of significant diagnostics from this hospitalization (including imaging, microbiology, ancillary and laboratory) are listed below for reference.    Significant Diagnostic Studies: Ct Head Wo Contrast  01/15/2012  *RADIOLOGY REPORT*  Clinical Data: Dizziness.  Tightness in the face.  CT HEAD WITHOUT CONTRAST  Technique:  Contiguous axial images were obtained from the base of the skull through the vertex without contrast.  Comparison: None.  Findings: Mild generalized atrophy is commensurate with age.  No acute cortical infarct, hemorrhage, or mass lesion is present. Hypoattenuation in the right paracentral brain stem is felt be artifactual.  The ventricles are normal size.  No significant extra- axial fluid collection is present.  A polyp or mucous retention cyst is noted  anteriorly within the right maxillary sinus.  The paranasal sinuses and mastoid air cells are clear.  Atherosclerotic calcifications are present within the cavernous carotid arteries and the vertebral arteries bilaterally.  IMPRESSION:  1.  Hypoattenuation along the right-sided brainstem is felt be artifactual. 2.  No other acute intracranial abnormality. 3.  Mild atrophy is within normal limits for age.   Original Report Authenticated By: Marin Roberts, M.D.    Dg Chest Port 1 View  01/17/2012  *RADIOLOGY REPORT*  Clinical Data: Cardiomyopathy.  Dizziness.  PORTABLE CHEST - 1 VIEW  Comparison: Chest x-rays dated 12/01/2009 12/24/2007  Findings: Transvenous defibrillator in place.  Chronic cardiomegaly.  Pulmonary vascularity is normal.  No acute infiltrates or effusions.  Chronic elevation of the right hemidiaphragm.  No acute osseous abnormality.  IMPRESSION: No acute abnormalities.  Chronic cardiomegaly.   Original Report Authenticated By: Francene Boyers, M.D.     Microbiology: Recent Results (from the past 240 hour(s))  URINE CULTURE     Status: Normal   Collection Time   01/15/12  2:00 PM      Component Value Range Status Comment   Specimen Description URINE, CLEAN CATCH   Final    Special Requests NONE   Final    Culture  Setup Time 01/15/2012 15:16   Final    Colony Count >=100,000 COLONIES/ML   Final    Culture ESCHERICHIA COLI   Final    Report Status 01/16/2012 FINAL   Final    Organism ID, Bacteria ESCHERICHIA COLI   Final   MRSA PCR SCREENING     Status: Normal   Collection Time   01/16/12 11:00 AM      Component Value Range Status Comment   MRSA by PCR NEGATIVE  NEGATIVE Final      Labs: Basic Metabolic Panel:  Lab 01/18/12 1308 01/16/12 0529 01/15/12 2114 01/15/12 1342  NA 140 141 -- 140  K 4.0 4.4 -- 4.3  CL 101 106 -- 101  CO2 31 28 -- 29  GLUCOSE 87 75 -- 126*  BUN 14 21 -- 20  CREATININE 0.71 0.68 -- 0.69  CALCIUM 9.0 9.0 -- 9.5  MG -- -- 1.9 --  PHOS -- --  -- --   Liver Function Tests:  Lab 01/15/12 2114  AST 19  ALT 18  ALKPHOS 67  BILITOT 0.2*  PROT 6.6  ALBUMIN 3.4*   No results found for  this basename: LIPASE:5,AMYLASE:5 in the last 168 hours No results found for this basename: AMMONIA:5 in the last 168 hours CBC:  Lab 01/16/12 0529 01/15/12 1342  WBC 8.3 7.1  NEUTROABS -- 4.6  HGB 12.1 13.3  HCT 36.6 40.1  MCV 93.1 93.3  PLT 271 306   Cardiac Enzymes:  Lab 01/15/12 1603  CKTOTAL --  CKMB --  CKMBINDEX --  TROPONINI <0.30   BNP: BNP (last 3 results)  Basename 01/16/12 2130  PROBNP 1625.0*   CBG: No results found for this basename: GLUCAP:5 in the last 168 hours     Signed:  Mykeal Carrick  [facility] 01/20/2012, 11:02 AM

## 2012-01-22 ENCOUNTER — Other Ambulatory Visit: Payer: Self-pay | Admitting: Cardiology

## 2012-01-22 MED ORDER — BENAZEPRIL HCL 10 MG PO TABS: 10.0000 mg | ORAL_TABLET | Freq: Every day | ORAL | Status: DC

## 2012-01-22 MED ORDER — SPIRONOLACTONE 25 MG PO TABS: 25.0000 mg | ORAL_TABLET | Freq: Every day | ORAL | Status: DC

## 2012-01-23 NOTE — Progress Notes (Signed)
UR chart review completed.  

## 2012-01-25 ENCOUNTER — Ambulatory Visit (INDEPENDENT_AMBULATORY_CARE_PROVIDER_SITE_OTHER): Payer: Medicare Other | Admitting: *Deleted

## 2012-01-25 ENCOUNTER — Encounter: Payer: Self-pay | Admitting: Internal Medicine

## 2012-01-25 DIAGNOSIS — I428 Other cardiomyopathies: Secondary | ICD-10-CM

## 2012-01-25 DIAGNOSIS — Z95810 Presence of automatic (implantable) cardiac defibrillator: Secondary | ICD-10-CM

## 2012-01-25 LAB — REMOTE ICD DEVICE
ATRIAL PACING ICD: 1 pct
DEVICE MODEL ICD: 583543
FVT: 4
HV IMPEDENCE: 54 Ohm
LV LEAD AMPLITUDE: 13.9 mv
TZAT-0001FASTVT: 1
TZAT-0013FASTVT: 2
TZAT-0018FASTVT: NEGATIVE
TZAT-0018FASTVT: NEGATIVE
TZST-0001FASTVT: 3
TZST-0001FASTVT: 4
TZST-0001FASTVT: 7
TZST-0001FASTVT: 8
TZST-0003FASTVT: 41 J
TZST-0003FASTVT: 41 J
VENTRICULAR PACING ICD: 87 pct
VF: 0

## 2012-01-31 ENCOUNTER — Encounter: Payer: Self-pay | Admitting: Physician Assistant

## 2012-01-31 ENCOUNTER — Ambulatory Visit (INDEPENDENT_AMBULATORY_CARE_PROVIDER_SITE_OTHER): Payer: Medicare Other | Admitting: Physician Assistant

## 2012-01-31 VITALS — BP 110/62 | HR 70 | Ht 60.0 in | Wt 121.0 lb

## 2012-01-31 DIAGNOSIS — G250 Essential tremor: Secondary | ICD-10-CM | POA: Insufficient documentation

## 2012-01-31 DIAGNOSIS — G252 Other specified forms of tremor: Secondary | ICD-10-CM

## 2012-01-31 DIAGNOSIS — I472 Ventricular tachycardia: Secondary | ICD-10-CM

## 2012-01-31 DIAGNOSIS — I428 Other cardiomyopathies: Secondary | ICD-10-CM

## 2012-01-31 LAB — BASIC METABOLIC PANEL
BUN: 19 mg/dL (ref 6–23)
CO2: 29 mEq/L (ref 19–32)
Glucose, Bld: 63 mg/dL — ABNORMAL LOW (ref 70–99)
Potassium: 4.5 mEq/L (ref 3.5–5.3)
Sodium: 138 mEq/L (ref 135–145)

## 2012-01-31 NOTE — Progress Notes (Signed)
HPI:  This is an elderly 78 year old female patient who is here for post hospital followup after an admission with paroxysmal ventricular tachycardia with firing of her ICD during hospitalization. She was treated with amiodarone loading. She also had a UTI that was treated.  The patient has a nonischemic cardiomyopathy ejection fraction 25%. She had normal coronary arteries on catheter in 2011. She had an ICD placed in 2003 for complete heart block with ventricular tachycardia.  The patient comes in today not feeling well. She was at a birthday party for her daughter-in-law until 10:00 last night and then she stayed up until 3 AM feeling at all her Christmas cards. She is extremely shaky and says she ran out of her primidone on Sunday. She denies any chest pain, palpitations, dyspnea, dyspnea on exertion, or syncope.  No Known Allergies  Current Outpatient Prescriptions on File Prior to Visit: amiodarone (PACERONE) 200 MG tablet, Take 1 tablet (200 mg total) by mouth 2 (two) times daily., Disp: 60 tablet, Rfl: 1 benazepril (LOTENSIN) 10 MG tablet, Take 1 tablet (10 mg total) by mouth daily., Disp: 30 tablet, Rfl: 6 Calcium Carbonate-Vitamin D (CALCIUM 600 + D PO), Take 1 tablet by mouth daily., Disp: , Rfl:  carvedilol (COREG) 3.125 MG tablet, Take 3.125 mg by mouth 2 (two) times daily with a meal., Disp: , Rfl:  diclofenac sodium (VOLTAREN) 1 % GEL, Apply 2 g topically 3 (three) times daily as needed., Disp: 1 Tube, Rfl: 1 fluticasone (FLONASE) 50 MCG/ACT nasal spray, Place 1 spray into the nose daily., Disp: 16 g, Rfl: 1 Multiple Vitamin (MULITIVITAMIN WITH MINERALS) TABS, Take 1 tablet by mouth every morning., Disp: , Rfl:  polyethylene glycol (MIRALAX / GLYCOLAX) packet, Take 17 g by mouth daily as needed (Constipation)., Disp: , Rfl:  primidone (MYSOLINE) 50 MG tablet, Take 75 mg by mouth 2 (two) times daily. , Disp: , Rfl:  spironolactone (ALDACTONE) 25 MG tablet, Take 1 tablet (25 mg total)  by mouth daily., Disp: 30 tablet, Rfl: 6 Travoprost, BAK Free, (TRAVATAN) 0.004 % SOLN ophthalmic solution, Place 1 drop into both eyes at bedtime., Disp: , Rfl:     Past Medical History:   Glaucoma(365)                                                CHF (congestive heart failure)                               HTN (hypertension)                                           Tremor                                                         Comment:sees Dr. Gerilyn Pilgrim, neurologist   Arthritis  Hip pain, left                                               Paroxysmal ventricular tachycardia              09/23/2008      Comment:Centricity Description: VENTRICULAR TACHYCARDIA              Qualifier: Diagnosis of  By: Via LPN, Larita Fife                 Centricity Description: PAROXYSMAL VENTRICULAR               TACHYCARDIA Qualifier: Diagnosis of  By: Via               LPN, Lynn     Nonischemic cardiomyopathy                      09/23/2008      Comment:Cardiac catheterization in 2011: EF of 25%;               normal coronary angiography.     Automatic implantable cardiac defibrillator in* 09/23/2008      Comment:Appropriate discharges in 2011 and 01/2012    Dizziness                                       01/15/2012    UTI (urinary tract infection)                   01/15/2012   Past Surgical History:   ICD implantation                                2003           Comment:[employer]; remote - no   ANKLE FRACTURE SURGERY                          1992           Comment:left   BLADDER SUSPENSION                                           ABDOMINAL HYSTERECTOMY                          1973           Comment:partial   TONSILLECTOMY                                   ages 63 an*     Comment:[place]   CATARACT EXTRACTION W/PHACO                     06/15/2011       Comment:Procedure: CATARACT EXTRACTION PHACO AND               INTRAOCULAR LENS PLACEMENT  (IOC);  Surgeon:  Gemma Payor, MD;  Location: [facility];  Service:               Ophthalmology;  Laterality: Right;  CDE:20.26   CATARACT EXTRACTION W/PHACO                     09/14/2011       Comment:Procedure: CATARACT EXTRACTION PHACO AND               INTRAOCULAR LENS PLACEMENT (IOC);  Surgeon:               Gemma Payor, MD;  Location: [facility];  Service:               Ophthalmology;  Laterality: Left;  CDE 31.96  Review of patient's family history indicates:   Coronary artery disease                                   Comment: family history; female <65   Social History   Marital Status: Widowed             Spouse Name:                      Years of Education:                 Number of children:             Occupational History   None on file  Social History Main Topics   Smoking Status: Never Smoker                     Smokeless Status: Never Used                       Comment: tobacco - use   Alcohol Use: No             Drug Use: No             Sexual Activity: Not on file        Other Topics            Concern   None on file  Social History Narrative   Widowed, retired, does not get regular exercise.     ROS:she complains of left hip problems and trouble walking up her stairs and is hoping to get a cortisone injection soon.   PHYSICAL EXAM:Elderly very shaky. Neck: No JVD, HJR, Bruit, or thyroid enlargement  Lungs: decreased breath sounds but No tachypnea, clear without wheezing, rales, or rhonchi  Cardiovascular: RRR, PMI not displaced, Doris 6 systolic murmur at the left stroke border, no gallops, bruit, thrill, or heave.  Abdomen: BS normal. Soft without organomegaly, masses, lesions or tenderness.  Extremities: without cyanosis, clubbing or edema. Good distal pulses bilateral  SKin: Warm, no lesions or rashes   Musculoskeletal: No deformities  Neuro: no focal signs  BP 110/62  Pulse 70  Ht 5' (1.524 m)  Wt 121 lb (54.885 kg)  BMI 23.63  kg/m2  SpO2 93%   JXB:JYNWGN sinus rhythm with ventricular pacing

## 2012-01-31 NOTE — Assessment & Plan Note (Addendum)
Patient well compensated without evidence of heart failure. Will f/u BMET.

## 2012-01-31 NOTE — Assessment & Plan Note (Signed)
Patient is on primidone for essential tremor prescribed by Dr. Christella Hartigan but she ran out of it Sunday. She is extremely shaky and does not feel well in general. I asked her to contact him immediately to obtain a refill to avoid any further withdrawal symptoms. Our office is helping facilitate that.

## 2012-01-31 NOTE — Patient Instructions (Addendum)
Your physician recommends that you schedule a follow-up appointment in: ONE MONTH WITH TW  YOUR PRESCRIPTION FOR PRIMIDONE HAS BEEN CALLED INTO YOUR PHARMACY/MD OFFICE  PLEASE PICK UP WHEN AVAILABLE AND RE-START MEDICATION  Your physician recommends that you return for lab work in: TODAY FOR BMET (SLIPS GIVEN)

## 2012-02-02 ENCOUNTER — Other Ambulatory Visit: Payer: Self-pay | Admitting: Cardiology

## 2012-02-02 MED ORDER — CARVEDILOL 3.125 MG PO TABS: 3.1250 mg | ORAL_TABLET | Freq: Two times a day (BID) | ORAL | Status: DC

## 2012-02-16 ENCOUNTER — Encounter: Payer: Self-pay | Admitting: *Deleted

## 2012-02-21 ENCOUNTER — Encounter: Payer: Self-pay | Admitting: *Deleted

## 2012-02-28 ENCOUNTER — Encounter: Payer: Self-pay | Admitting: Cardiology

## 2012-02-28 ENCOUNTER — Ambulatory Visit (INDEPENDENT_AMBULATORY_CARE_PROVIDER_SITE_OTHER): Payer: Medicare Other | Admitting: Cardiology

## 2012-02-28 VITALS — BP 117/60 | HR 55 | Ht 60.0 in | Wt 122.1 lb

## 2012-02-28 DIAGNOSIS — I428 Other cardiomyopathies: Secondary | ICD-10-CM

## 2012-02-28 DIAGNOSIS — I472 Ventricular tachycardia: Secondary | ICD-10-CM

## 2012-02-28 DIAGNOSIS — I5022 Chronic systolic (congestive) heart failure: Secondary | ICD-10-CM

## 2012-02-28 DIAGNOSIS — I442 Atrioventricular block, complete: Secondary | ICD-10-CM

## 2012-02-28 DIAGNOSIS — Z95810 Presence of automatic (implantable) cardiac defibrillator: Secondary | ICD-10-CM

## 2012-02-28 NOTE — Patient Instructions (Addendum)
Your physician recommends that you schedule a follow-up appointment in: 6 months  You may take Aleve for arthritic pain as needed

## 2012-02-28 NOTE — Assessment & Plan Note (Signed)
Stable. Good compliance with medications, diet, daily weights. No changes made. Return the office in 6 months.

## 2012-02-28 NOTE — Progress Notes (Signed)
HPI [name] returns today for evaluation and management of her nonischemic cardiomyopathy, history of nonsustained ventricular tachycardia, history defibrillator, and chronic systolic heart failure.  Her biggest complaint is hip pain. She walks with a walker. She has not fallen. Her daughter-in-law is with her and confirms this.  Her weight remains stable. She tries to weigh daily. She's very compliant with her medications. She denies orthopnea, PND or edema.  She denies any palpitations, presyncope or syncope.  She also notes if she can take Aleve. He was stopped in the hospital.  Past Medical History  Diagnosis Date  . Glaucoma(365)   . CHF (congestive heart failure)   . HTN (hypertension)   . Tremor     sees Dr. Gerilyn Pilgrim, neurologist  . Arthritis   . Hip pain, left   . Paroxysmal ventricular tachycardia 09/23/2008    Centricity Description: VENTRICULAR TACHYCARDIA Qualifier: Diagnosis of  By: Via LPN, Larita Fife   Centricity Description: PAROXYSMAL VENTRICULAR TACHYCARDIA Qualifier: Diagnosis of  By: Via LPN, Larita Fife    . Nonischemic cardiomyopathy 09/23/2008    Cardiac catheterization in 2011: EF of 25%; normal coronary angiography.    . Automatic implantable cardiac defibrillator in situ 09/23/2008    Appropriate discharges in 2011 and 01/2012   . Dizziness 01/15/2012  . UTI (urinary tract infection) 01/15/2012    Current Outpatient Prescriptions  Medication Sig Dispense Refill  . amiodarone (PACERONE) 200 MG tablet Take 1 tablet (200 mg total) by mouth 2 (two) times daily.  60 tablet  1  . benazepril (LOTENSIN) 10 MG tablet Take 1 tablet (10 mg total) by mouth daily.  30 tablet  6  . Calcium Carbonate-Vitamin D (CALCIUM 600 + D PO) Take 1 tablet by mouth daily.      . carvedilol (COREG) 3.125 MG tablet Take 1 tablet (3.125 mg total) by mouth 2 (two) times daily with a meal.  60 tablet  1  . fluticasone (FLONASE) 50 MCG/ACT nasal spray Place 1 spray into the nose daily.  16 g  1  .  HYDROcodone-acetaminophen (NORCO/VICODIN) 5-325 MG per tablet Take 1 tablet by mouth every 4 (four) hours as needed.       . Multiple Vitamin (MULITIVITAMIN WITH MINERALS) TABS Take 1 tablet by mouth every morning.      . polyethylene glycol (MIRALAX / GLYCOLAX) packet Take 17 g by mouth daily as needed (Constipation).      . primidone (MYSOLINE) 50 MG tablet Take 75 mg by mouth 2 (two) times daily.       Marland Kitchen spironolactone (ALDACTONE) 25 MG tablet Take 1 tablet (25 mg total) by mouth daily.  30 tablet  6  . Travoprost, BAK Free, (TRAVATAN) 0.004 % SOLN ophthalmic solution Place 1 drop into both eyes at bedtime.        No Known Allergies  Family History  Problem Relation Age of Onset  . Coronary artery disease      family history; female <65    History   Social History  . Marital Status: Widowed    Spouse Name: N/A    Number of Children: N/A  . Years of Education: N/A   Occupational History  . Not on file.   Social History Main Topics  . Smoking status: Never Smoker   . Smokeless tobacco: Never Used     Comment: tobacco - use  . Alcohol Use: No  . Drug Use: No  . Sexually Active: Not on file   Other Topics Concern  . Not  on file   Social History Narrative   Widowed, retired, does not get regular exercise.     ROS ALL NEGATIVE EXCEPT THOSE NOTED IN HPI  PE  General Appearance: well developed, well nourished in no acute distress, frail, elderly, resting tremor HEENT: symmetrical face, PERRLA, good dentition  Neck: no JVD, thyromegaly, or adenopathy, trachea midline Chest: symmetric without deformity Cardiac: PMI non-displaced, RRR, normal S1, S2, no gallop or murmur Lung: clear to ausculation and percussion Vascular: all pulses full without bruits  Abdominal: nondistended, nontender, good bowel sounds, no HSM, no bruits Extremities: no cyanosis, clubbing or edema, no sign of DVT, no varicosities  Skin: normal color, no rashes Neuro: alert and oriented x 3,  non-focal Pysch: normal affect  EKG  BMET    Component Value Date/Time   NA 138 01/31/2012 1104   K 4.5 01/31/2012 1104   CL 99 01/31/2012 1104   CO2 29 01/31/2012 1104   GLUCOSE 63* 01/31/2012 1104   BUN 19 01/31/2012 1104   CREATININE 0.90 01/31/2012 1104   CREATININE 0.71 01/18/2012 0423   CALCIUM 9.9 01/31/2012 1104   GFRNONAA 76* 01/18/2012 0423   GFRAA 88* 01/18/2012 0423    Lipid Panel     Component Value Date/Time   CHOL 255* 08/21/2011 0000   TRIG 145 08/21/2011 0000   HDL 56 08/21/2011 0000   CHOLHDL 4.6 08/21/2011 0000   VLDL 29 08/21/2011 0000   LDLCALC 170* 08/21/2011 0000    CBC    Component Value Date/Time   WBC 8.3 01/16/2012 0529   RBC 3.93 01/16/2012 0529   HGB 12.1 01/16/2012 0529   HCT 36.6 01/16/2012 0529   PLT 271 01/16/2012 0529   MCV 93.1 01/16/2012 0529   MCH 30.8 01/16/2012 0529   MCHC 33.1 01/16/2012 0529   RDW 13.9 01/16/2012 0529   LYMPHSABS 1.7 01/15/2012 1342   MONOABS 0.5 01/15/2012 1342   EOSABS 0.3 01/15/2012 1342   BASOSABS 0.1 01/15/2012 1342

## 2012-03-26 ENCOUNTER — Other Ambulatory Visit: Payer: Self-pay | Admitting: Cardiology

## 2012-03-26 MED ORDER — AMIODARONE HCL 200 MG PO TABS: 200.0000 mg | ORAL_TABLET | Freq: Two times a day (BID) | ORAL | Status: DC

## 2012-04-02 ENCOUNTER — Other Ambulatory Visit: Payer: Self-pay | Admitting: Cardiology

## 2012-04-02 MED ORDER — CARVEDILOL 3.125 MG PO TABS: 3.1250 mg | ORAL_TABLET | Freq: Two times a day (BID) | ORAL | Status: DC

## 2012-04-25 ENCOUNTER — Encounter: Payer: Self-pay | Admitting: Internal Medicine

## 2012-04-25 ENCOUNTER — Ambulatory Visit (INDEPENDENT_AMBULATORY_CARE_PROVIDER_SITE_OTHER): Payer: Medicare Other | Admitting: Internal Medicine

## 2012-04-25 VITALS — BP 118/54 | HR 111 | Ht 60.0 in | Wt 120.0 lb

## 2012-04-25 DIAGNOSIS — I472 Ventricular tachycardia, unspecified: Secondary | ICD-10-CM

## 2012-04-25 DIAGNOSIS — I442 Atrioventricular block, complete: Secondary | ICD-10-CM

## 2012-04-25 DIAGNOSIS — I5022 Chronic systolic (congestive) heart failure: Secondary | ICD-10-CM

## 2012-04-25 DIAGNOSIS — Z95810 Presence of automatic (implantable) cardiac defibrillator: Secondary | ICD-10-CM

## 2012-04-25 LAB — ICD DEVICE OBSERVATION
AL IMPEDENCE ICD: 458 Ohm
AL THRESHOLD: 1.2 V
LV LEAD IMPEDENCE ICD: 608 Ohm
LV LEAD THRESHOLD: 2.6 V
RV LEAD THRESHOLD: 1.1 V
TZAT-0001FASTVT: 1
TZAT-0001FASTVT: 2
TZAT-0013FASTVT: 2
TZON-0003FASTVT: 375 ms
TZST-0001FASTVT: 5
TZST-0001FASTVT: 6
TZST-0003FASTVT: 31 J
TZST-0003FASTVT: 41 J
TZST-0003FASTVT: 41 J
TZST-0003FASTVT: 41 J

## 2012-04-25 NOTE — Patient Instructions (Addendum)
Remote Transmission 6/14   Your physician wants you to follow-up in: 6 months You will receive a reminder letter in the mail two months in advance. If you don't receive a letter, please call our office to schedule the follow-up appointment.   Continue same medication

## 2012-04-25 NOTE — Progress Notes (Signed)
HPI [name] returns today for followup. She is a pleasant 79 year old woman with a nonischemic cardiomyopathy, chronic systolic heart failure, complete heart block, ventricular tachycardia,  On amiodarone therapy, and severe arthritis.  The patient was hospitalized several months ago with heart failure and tachycardia. She remains on amiodarone. She has had worsening pain in her left hip. She denies syncope. No peripheral edema. Her heart failure is class II. No Known Allergies   Current Outpatient Prescriptions  Medication Sig Dispense Refill  . amiodarone (PACERONE) 200 MG tablet Take 1 tablet (200 mg total) by mouth 2 (two) times daily.  60 tablet  1  . aspirin 325 MG tablet Take 325 mg by mouth as needed for pain.      . benazepril (LOTENSIN) 10 MG tablet Take 1 tablet (10 mg total) by mouth daily.  30 tablet  6  . Calcium Carbonate-Vitamin D (CALCIUM 600 + D PO) Take 1 tablet by mouth daily.      . carvedilol (COREG) 3.125 MG tablet Take 1 tablet (3.125 mg total) by mouth 2 (two) times daily with a meal.  60 tablet  3  . fluticasone (FLONASE) 50 MCG/ACT nasal spray Place 1 spray into the nose daily.  16 g  1  . HYDROcodone-acetaminophen (NORCO/VICODIN) 5-325 MG per tablet Take 1 tablet by mouth every 4 (four) hours as needed.       . Multiple Vitamin (MULITIVITAMIN WITH MINERALS) TABS Take 1 tablet by mouth every morning.      . polyethylene glycol (MIRALAX / GLYCOLAX) packet Take 17 g by mouth daily as needed (Constipation).      . primidone (MYSOLINE) 50 MG tablet Take 75 mg by mouth 2 (two) times daily.       Marland Kitchen spironolactone (ALDACTONE) 25 MG tablet Take 1 tablet (25 mg total) by mouth daily.  30 tablet  6   No current facility-administered medications for this visit.     Past Medical History  Diagnosis Date  . Glaucoma(365)   . CHF (congestive heart failure)   . HTN (hypertension)   . Tremor     sees Dr. Gerilyn Pilgrim, neurologist  . Arthritis   . Hip pain, left   .  Paroxysmal ventricular tachycardia 09/23/2008    Centricity Description: VENTRICULAR TACHYCARDIA Qualifier: Diagnosis of  By: Via LPN, Larita Fife   Centricity Description: PAROXYSMAL VENTRICULAR TACHYCARDIA Qualifier: Diagnosis of  By: Via LPN, Larita Fife    . Nonischemic cardiomyopathy 09/23/2008    Cardiac catheterization in 2011: EF of 25%; normal coronary angiography.    . Automatic implantable cardiac defibrillator in situ 09/23/2008    Appropriate discharges in 2011 and 01/2012   . Dizziness 01/15/2012  . UTI (urinary tract infection) 01/15/2012    ROS:   All systems reviewed and negative except as noted in the HPI.   Past Surgical History  Procedure Laterality Date  . Icd implantation  2003    [employer]; remote - no  . Ankle fracture surgery  1992    left  . Bladder suspension    . Abdominal hysterectomy  1973    partial  . Tonsillectomy  ages 56 and 89    [place]  . Cataract extraction w/phaco  06/15/2011    Procedure: CATARACT EXTRACTION PHACO AND INTRAOCULAR LENS PLACEMENT (IOC);  Surgeon: Gemma Payor, MD;  Location: [facility];  Service: Ophthalmology;  Laterality: Right;  CDE:20.26  . Cataract extraction w/phaco  09/14/2011    Procedure: CATARACT EXTRACTION PHACO AND INTRAOCULAR LENS PLACEMENT (  IOC);  Surgeon: Gemma Payor, MD;  Location: [facility];  Service: Ophthalmology;  Laterality: Left;  CDE 31.96     Family History  Problem Relation Age of Onset  . Coronary artery disease      family history; female <65     History   Social History  . Marital Status: Widowed    Spouse Name: N/A    Number of Children: N/A  . Years of Education: N/A   Occupational History  . Not on file.   Social History Main Topics  . Smoking status: Never Smoker   . Smokeless tobacco: Never Used     Comment: tobacco - use  . Alcohol Use: No  . Drug Use: No  . Sexually Active: Not on file   Other Topics Concern  . Not on file   Social History Narrative   Widowed, retired, does not get regular  exercise.      BP 118/54  Pulse 111  Ht 5' (1.524 m)  Wt 120 lb (54.432 kg)  BMI 23.44 kg/m2  SpO2 92%  Physical Exam:  Elderly and tremulous appearing woman, NAD HEENT: Unremarkable Neck:  7 cm JVD, no thyromegally Lungs:  Clear with no wheezes, or rhonchi. Scattered rales are present in the bases HEART:  Regular rate rhythm, no murmurs, no rubs, no clicks Abd:  soft, positive bowel sounds, no organomegally, no rebound, no guarding Ext:  2 plus pulses, no edema, no cyanosis, no clubbing Skin:  No rashes no nodules Neuro:  CN II through XII intact, motor grossly intact   DEVICE  Normal device function.  See PaceArt for details.   Assess/Plan:

## 2012-04-25 NOTE — Assessment & Plan Note (Signed)
Interrogation of her biventricular ICD demonstrates stable function. Her LV pacing threshold is increased. She has a satisfactory margin of safety. We will have to watch this closely. I do not think she is a candidate for left ventricular lead revision.

## 2012-04-25 NOTE — Assessment & Plan Note (Signed)
Her tachycardia appears to be well-controlled. She will continue fairly high dose amiodarone for now, though we would consider reducing her amiodarone dose in the next 4-8 months.

## 2012-04-25 NOTE — Assessment & Plan Note (Signed)
Her chronic systolic heart failure symptoms remain class II. We have discussed the importance of a low-sodium diet, and continued medical therapy.

## 2012-04-29 ENCOUNTER — Encounter: Payer: Self-pay | Admitting: Internal Medicine

## 2012-05-17 ENCOUNTER — Encounter: Payer: Self-pay | Admitting: *Deleted

## 2012-05-21 ENCOUNTER — Encounter: Payer: Self-pay | Admitting: Family Medicine

## 2012-05-21 ENCOUNTER — Ambulatory Visit (INDEPENDENT_AMBULATORY_CARE_PROVIDER_SITE_OTHER): Payer: Medicare Other | Admitting: Family Medicine

## 2012-05-21 VITALS — BP 110/58 | HR 60 | Wt 121.0 lb

## 2012-05-21 DIAGNOSIS — G250 Essential tremor: Secondary | ICD-10-CM

## 2012-05-21 DIAGNOSIS — M1990 Unspecified osteoarthritis, unspecified site: Secondary | ICD-10-CM

## 2012-05-21 DIAGNOSIS — E782 Mixed hyperlipidemia: Secondary | ICD-10-CM

## 2012-05-21 DIAGNOSIS — I5022 Chronic systolic (congestive) heart failure: Secondary | ICD-10-CM

## 2012-05-21 DIAGNOSIS — Z79899 Other long term (current) drug therapy: Secondary | ICD-10-CM

## 2012-05-21 DIAGNOSIS — I10 Essential (primary) hypertension: Secondary | ICD-10-CM

## 2012-05-21 MED ORDER — NAPROXEN 500 MG PO TABS: 500.0000 mg | ORAL_TABLET | Freq: Two times a day (BID) | ORAL | Status: DC

## 2012-05-21 MED ORDER — PRIMIDONE 50 MG PO TABS: 50.0000 mg | ORAL_TABLET | Freq: Three times a day (TID) | ORAL | Status: DC

## 2012-05-21 MED ORDER — ALENDRONATE SODIUM 70 MG PO TABS: 70.0000 mg | ORAL_TABLET | ORAL | Status: DC

## 2012-05-21 NOTE — Progress Notes (Signed)
  Subjective:    Patient ID: [name], female    DOB: [birth year], 79 y.o.   MRN: [id number]  Leg Pain  The incident occurred more than 1 week ago. The pain is present in the left hip. The pain is at a severity of 5/10. The pain has been intermittent since onset. The symptoms are aggravated by movement. She has tried nothing for the symptoms. The treatment provided moderate relief.  diet so-so. Try to cut down salt  Patient has ongoing challenges with left hip arthritis. The cardiologist has stated she should not have surgery. The orthopedist and stated that at this point joint pain that will really help is surgery. Steroid shots helped some but not a lot. Taking only for hydrocodone tablets daily. States not helping her.  History of osteoporosis. Patient stop the Fosamax due to finances. She now would like to try to gain.  Patient has hyperlipidemia. Claims compliance with medicine. Unable to exercise currently.  Review of Systems  Constitutional: Positive for fatigue. Negative for activity change and appetite change.  HENT: Negative for congestion, rhinorrhea, neck pain and ear discharge.   Eyes: Negative for discharge.  Respiratory: Positive for shortness of breath. Negative for cough, chest tightness and wheezing.   Cardiovascular: Negative for chest pain.  Gastrointestinal: Negative for vomiting and abdominal pain.  Genitourinary: Negative for frequency and difficulty urinating.  Allergic/Immunologic: Negative for environmental allergies and food allergies.  Neurological: Positive for weakness. Negative for headaches.  Psychiatric/Behavioral: Negative for behavioral problems and agitation.       Objective:   Physical Exam  Alert no acute distress. Talkative. Tearful at times. Lungs clear. Heart rare rhythm. Hip positive pain with motion.      Assessment & Plan:  Impression #1 progressive arthritis hip discussed at great length. #2 osteoporosis discuss. #3  hyperlipidemia status uncertain. #4 hypertension good control. #5 herbal additive concerns discussed. Plan easily 40 minutes spent most in discussion. Appropriate blood work. Increase hydrocodone to one and half tablets 4 times a day. Start Fosamax 70 daily. Start Naprosyn 500 twice a day. Further recommendations based on blood work. Recheck in 3 months. WSL

## 2012-05-24 LAB — HEPATIC FUNCTION PANEL
AST: 20 U/L (ref 0–37)
Alkaline Phosphatase: 62 U/L (ref 39–117)
Bilirubin, Direct: 0.1 mg/dL (ref 0.0–0.3)
Total Bilirubin: 0.3 mg/dL (ref 0.3–1.2)

## 2012-05-24 LAB — LIPID PANEL
Cholesterol: 230 mg/dL — ABNORMAL HIGH (ref 0–200)
HDL: 64 mg/dL (ref >39)

## 2012-05-28 ENCOUNTER — Encounter: Payer: Self-pay | Admitting: Family Medicine

## 2012-05-29 ENCOUNTER — Other Ambulatory Visit: Payer: Self-pay | Admitting: *Deleted

## 2012-05-29 MED ORDER — AMIODARONE HCL 200 MG PO TABS: 200.0000 mg | ORAL_TABLET | Freq: Two times a day (BID) | ORAL | Status: DC

## 2012-06-10 ENCOUNTER — Other Ambulatory Visit (HOSPITAL_COMMUNITY): Payer: Self-pay | Admitting: Orthopaedic Surgery

## 2012-06-10 DIAGNOSIS — R52 Pain, unspecified: Secondary | ICD-10-CM

## 2012-06-12 ENCOUNTER — Ambulatory Visit (HOSPITAL_COMMUNITY)
Admission: RE | Admit: 2012-06-12 | Discharge: 2012-06-12 | Disposition: A | Discharge status: Home / Self Care | Payer: Medicare Other | Source: Ambulatory Visit | Attending: Orthopaedic Surgery | Admitting: Orthopaedic Surgery

## 2012-06-12 DIAGNOSIS — R52 Pain, unspecified: Secondary | ICD-10-CM

## 2012-06-12 DIAGNOSIS — M25559 Pain in unspecified hip: Secondary | ICD-10-CM | POA: Insufficient documentation

## 2012-06-12 MED ORDER — IOHEXOL 300 MG/ML  SOLN
10.0000 mL | Freq: Once | INTRAMUSCULAR | Status: AC | PRN
  Administered 2012-06-12: 10 mL via INTRATHECAL

## 2012-06-12 MED ORDER — METHYLPREDNISOLONE ACETATE 40 MG/ML INJ SUSP (RADIOLOG: 160.0000 mg | Freq: Once | INTRAMUSCULAR | Status: DC

## 2012-07-25 ENCOUNTER — Encounter: Payer: Self-pay | Admitting: Internal Medicine

## 2012-07-25 ENCOUNTER — Ambulatory Visit (INDEPENDENT_AMBULATORY_CARE_PROVIDER_SITE_OTHER): Payer: Medicare Other | Admitting: *Deleted

## 2012-07-25 DIAGNOSIS — Z95810 Presence of automatic (implantable) cardiac defibrillator: Secondary | ICD-10-CM

## 2012-07-25 DIAGNOSIS — I428 Other cardiomyopathies: Secondary | ICD-10-CM

## 2012-08-05 ENCOUNTER — Other Ambulatory Visit: Payer: Self-pay

## 2012-08-05 MED ORDER — CARVEDILOL 3.125 MG PO TABS: 3.1250 mg | ORAL_TABLET | Freq: Two times a day (BID) | ORAL | Status: DC

## 2012-08-19 LAB — REMOTE ICD DEVICE
ATRIAL PACING ICD: 41 pct
FVT: 0
PACEART VT: 0
TOT-0006: 20140313000000
TZAT-0001FASTVT: 2
TZAT-0013FASTVT: 2
TZON-0003FASTVT: 375 ms
TZST-0001FASTVT: 3
TZST-0001FASTVT: 6
TZST-0001FASTVT: 7
TZST-0003FASTVT: 41 J
TZST-0003FASTVT: 41 J
TZST-0003FASTVT: 41 J
VENTRICULAR PACING ICD: 96 pct

## 2012-08-20 ENCOUNTER — Ambulatory Visit (INDEPENDENT_AMBULATORY_CARE_PROVIDER_SITE_OTHER): Payer: Medicare Other | Admitting: Family Medicine

## 2012-08-20 ENCOUNTER — Encounter: Payer: Self-pay | Admitting: Family Medicine

## 2012-08-20 VITALS — BP 130/90 | HR 70 | Wt 129.5 lb

## 2012-08-20 DIAGNOSIS — M25519 Pain in unspecified shoulder: Secondary | ICD-10-CM

## 2012-08-20 DIAGNOSIS — G8929 Other chronic pain: Secondary | ICD-10-CM

## 2012-08-20 DIAGNOSIS — M25559 Pain in unspecified hip: Secondary | ICD-10-CM

## 2012-08-20 DIAGNOSIS — E785 Hyperlipidemia, unspecified: Secondary | ICD-10-CM

## 2012-08-20 DIAGNOSIS — G250 Essential tremor: Secondary | ICD-10-CM

## 2012-08-20 DIAGNOSIS — M25511 Pain in right shoulder: Secondary | ICD-10-CM

## 2012-08-20 DIAGNOSIS — I10 Essential (primary) hypertension: Secondary | ICD-10-CM

## 2012-08-20 MED ORDER — BENAZEPRIL HCL 10 MG PO TABS: 10.0000 mg | ORAL_TABLET | Freq: Every day | ORAL | Status: DC

## 2012-08-20 NOTE — Progress Notes (Signed)
  Subjective:    Patient ID: [name], female    DOB: Oct 15, 1923, 79 y.o.   MRN: [id number]  HPI Tremor very aggravating. Patient has been on multiple medications for this in the past. Frustrating that nothing else can be done.  Left foot swelling on further history both feet swell at times.  Patient claims compliance with her blood pressure medication. Trying to watch her salt intake.  Ongoing challenges with MiraLAX however control is good. with as long as she stays on it.  Patient reports shoulder pain. Took a fall. On outstretched shoulder.  History of pacemaker occasional jumping sensation in chest.   Review of Systems No weight loss no weight gain no headache ROS otherwise negative    Objective:   Physical Exam  Alert tearful during exam. HEENT normal. Lungs clear. Heart regular rate and rhythm. Hands essential tremor present. Right shoulder hematoma evident. Some pain with rotation. Feet trace edema left somewhat more than right pulses good.      Assessment & Plan:  Impression 1 shoulder injury. #2 essential tremor very aggravating to patient. #3 progressive arthritis. #4 congestive heart failure clinically stable at this time. #5 chronic anxiety discussed. #6 osteoporosis discuss. Plan maintain all meds. Shoulder x-ray. Neuro referral for tremor management rationale discussed. Easily 35 minutes spent in discussion regarding patient's concerns. WSL

## 2012-08-21 ENCOUNTER — Other Ambulatory Visit: Payer: Self-pay | Admitting: *Deleted

## 2012-08-21 MED ORDER — SPIRONOLACTONE 25 MG PO TABS: 25.0000 mg | ORAL_TABLET | Freq: Every day | ORAL | Status: DC

## 2012-08-23 ENCOUNTER — Encounter: Payer: Self-pay | Admitting: *Deleted

## 2012-08-29 ENCOUNTER — Ambulatory Visit (HOSPITAL_COMMUNITY)
Admission: RE | Admit: 2012-08-29 | Discharge: 2012-08-29 | Disposition: A | Discharge status: Home / Self Care | Payer: Medicare Other | Source: Ambulatory Visit | Attending: Family Medicine | Admitting: Family Medicine

## 2012-08-29 DIAGNOSIS — M25519 Pain in unspecified shoulder: Secondary | ICD-10-CM | POA: Insufficient documentation

## 2012-08-29 DIAGNOSIS — W19XXXS Unspecified fall, sequela: Secondary | ICD-10-CM | POA: Insufficient documentation

## 2012-09-03 ENCOUNTER — Telehealth: Payer: Self-pay | Admitting: Neurology

## 2012-09-04 ENCOUNTER — Ambulatory Visit: Payer: Medicare Other | Admitting: Neurology

## 2012-09-19 ENCOUNTER — Encounter: Payer: Self-pay | Admitting: Neurology

## 2012-09-19 ENCOUNTER — Ambulatory Visit (INDEPENDENT_AMBULATORY_CARE_PROVIDER_SITE_OTHER): Payer: Medicare Other | Admitting: Neurology

## 2012-09-19 VITALS — BP 126/63 | HR 49 | Ht 59.0 in | Wt 112.0 lb

## 2012-09-19 DIAGNOSIS — G20 Parkinson's disease: Secondary | ICD-10-CM

## 2012-09-19 DIAGNOSIS — M25511 Pain in right shoulder: Secondary | ICD-10-CM

## 2012-09-19 DIAGNOSIS — M25519 Pain in unspecified shoulder: Secondary | ICD-10-CM

## 2012-09-19 MED ORDER — CARBIDOPA-LEVODOPA 25-100 MG PO TABS: ORAL_TABLET | ORAL | Status: DC

## 2012-09-19 NOTE — Progress Notes (Addendum)
[facility]  Provider:  Dr Hosie Poisson Referring Provider: Merlyn Albert, MD Primary Care Physician:  Harlow Asa, MD  No chief complaint on file.   HPI:  [name] is a 79 y.o. female here as a referral from Dr. Gerda Diss for evaluation of bilateral hand tremor refractory to medication.  She notes the tremor started in her mid to late 68s. Initially noticed a rest tremor which she believes was right sided. Eventually spread to the left side and involved a rest action and intention component. Feels that the tremor comes and goes. It is worse with stress and anxiety. Remains predominantly a rest tremor . She does make a mess eating  due to the tremor. Is getting progressively worse. She recently has noticed a lower lip tremor. She also notes a generalized slowing down and increased difficulty moving. Has had worsening trouble walking. Has had some falls. Walks with the assistance of a walker. Feels she is unable to walk on her own. Denies any stiffness. 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 Notes her sense of smell is good. Notes micrographia. Occasional difficulty swallowing. Unsure if there is a change in her voice. Does not drink so unsure if alcohol helps tremor. Does not notice a difference in tremor with caffeine. As any exposure to dopamine blocking agents, no exposure to heavy metals lead well water.  She saw a neurologist in [place], Dr Gerilyn Pilgrim, who diagnosed her with ET a few years ago and started her on Primidone. Has been titrated up to 50 mg 3 times a day. Has not noted any benefit on this medication. Has not tried anything off in the past. She reports having a maternal grandfather who had tremors she is unsure what his diagnosis was. She also has a brother who has tremors, she is unsure of his diagnosis. They both developed tremors in their later years.   Review of Systems: Out of a complete 14 system review, the patient complains of only the following  symptoms, and all other reviewed systems are negative. Positive for fatigue easy bruising, swelling in feet, constipation joint pain aching muscles ringing nose incontinence moles tremor weakness   History   Social History  . Marital Status: Widowed    Spouse Name: N/A    Number of Children: N/A  . Years of Education: N/A   Occupational History  . Not on file.   Social History Main Topics  . Smoking status: Never Smoker   . Smokeless tobacco: Never Used     Comment: tobacco - use  . Alcohol Use: No  . Drug Use: No  . Sexually Active: Not on file   Other Topics Concern  . Not on file   Social History Narrative   Widowed, retired, does not get regular exercise.     Family History  Problem Relation Age of Onset  . Coronary artery disease      family history; female <65  . Cancer Father     Lung  . Asthma Mother     Past Medical History  Diagnosis Date  . Glaucoma   . CHF (congestive heart failure)   . HTN (hypertension)   . Tremor     sees Dr. Gerilyn Pilgrim, neurologist  . Arthritis   . Hip pain, left   . Paroxysmal ventricular tachycardia 09/23/2008    Centricity Description: VENTRICULAR TACHYCARDIA Qualifier: Diagnosis of  By: Via LPN, Larita Fife   Centricity Description: PAROXYSMAL VENTRICULAR TACHYCARDIA Qualifier: Diagnosis of  By: Via LPN, Larita Fife    .  Nonischemic cardiomyopathy 09/23/2008    Cardiac catheterization in 2011: EF of 25%; normal coronary angiography.    . Automatic implantable cardiac defibrillator in situ 09/23/2008    Appropriate discharges in 2011 and 01/2012   . Dizziness 01/15/2012  . UTI (urinary tract infection) 01/15/2012  . Anxiety   . Hyperlipidemia   . Osteoporosis     Past Surgical History  Procedure Laterality Date  . Icd implantation  2003    [employer]; remote - no  . Ankle fracture surgery  1992    left  . Bladder suspension    . Abdominal hysterectomy  1973    partial  . Tonsillectomy  ages 73 and 7    [place]  . Cataract  extraction w/phaco  06/15/2011    Procedure: CATARACT EXTRACTION PHACO AND INTRAOCULAR LENS PLACEMENT (IOC);  Surgeon: Gemma Payor, MD;  Location: [facility];  Service: Ophthalmology;  Laterality: Right;  CDE:20.26  . Cataract extraction w/phaco  09/14/2011    Procedure: CATARACT EXTRACTION PHACO AND INTRAOCULAR LENS PLACEMENT (IOC);  Surgeon: Gemma Payor, MD;  Location: [facility];  Service: Ophthalmology;  Laterality: Left;  CDE 31.96    Current Outpatient Prescriptions  Medication Sig Dispense Refill  . acetaminophen (TYLENOL) 500 MG tablet Take 500 mg by mouth every 6 (six) hours as needed for pain.      Marland Kitchen alendronate (FOSAMAX) 70 MG tablet Take 1 tablet (70 mg total) by mouth every 7 (seven) days. Take with a full glass of water on an empty stomach.  4 tablet  11  . amiodarone (PACERONE) 200 MG tablet Take 1 tablet (200 mg total) by mouth 2 (two) times daily.  60 tablet  6  . benazepril (LOTENSIN) 10 MG tablet Take 1 tablet (10 mg total) by mouth daily.  30 tablet  6  . Calcium Carbonate-Vitamin D (CALCIUM 600 + D PO) Take 1 tablet by mouth daily.      . carvedilol (COREG) 3.125 MG tablet Take 1 tablet (3.125 mg total) by mouth 2 (two) times daily with a meal.  60 tablet  3  . Cholecalciferol (VITAMIN D-3) 1000 UNITS CAPS Take by mouth daily.      . fluticasone (FLONASE) 50 MCG/ACT nasal spray Place 1 spray into the nose daily.  16 g  1  . HYDROcodone-acetaminophen (NORCO/VICODIN) 5-325 MG per tablet Take 1 tablet by mouth every 4 (four) hours as needed.       . Multiple Vitamin (MULITIVITAMIN WITH MINERALS) TABS Take 1 tablet by mouth every morning.      . naproxen (NAPROSYN) 500 MG tablet Take 1 tablet (500 mg total) by mouth 2 (two) times daily with a meal.  60 tablet  11  . naproxen sodium (ANAPROX) 220 MG tablet Take 220 mg by mouth 2 (two) times daily with a meal.      . Omega-3 Fatty Acids (FISH OIL PO) Take by mouth daily.      . polyethylene glycol (MIRALAX / GLYCOLAX) packet Take 17 g by mouth  daily as needed (Constipation).      . primidone (MYSOLINE) 50 MG tablet Take 1 tablet (50 mg total) by mouth 3 (three) times daily.  90 tablet  11  . spironolactone (ALDACTONE) 25 MG tablet Take 1 tablet (25 mg total) by mouth daily.  30 tablet  3   No current facility-administered medications for this visit.    Allergies as of 09/19/2012 - Review Complete 08/20/2012  Allergen Reaction Noted  . Lipitor (  atorvastatin)  05/17/2012  . Pravachol (pravastatin sodium)  05/17/2012  . Zetia (ezetimibe)  05/17/2012    Vitals: There were no vitals taken for this visit. Last Weight:  Wt Readings from Last 1 Encounters:  08/20/12 129 lb 8 oz (58.741 kg)   Last Height:   Ht Readings from Last 1 Encounters:  04/25/12 5' (1.524 m)     Physical exam: Exam: Gen: NAD, conversant Eyes: anicteric sclerae, moist conjunctivae HENT: [place] Neck: Trachea midline; supple,  Lungs: CTA, no wheezing, rales, rhonic                          CV: RRR, no MRG Abdomen: Soft, non-tender;  Extremities: No peripheral edema, bluish/erythema discoloration of bilateral lower extremites Skin: Normal temperature, no rash,  Psych: Appropriate affect, pleasant  Neuro: MS: AA&Ox3, appropriately interactive, normal affect   Speech: fluent w/o paraphasic error  Memory: good recent and remote recall  CN: PERRL, EOMI no nystagmus, no ptosis, sensation intact to LT V1-V3 bilat, face symmetric, no weakness, hearing grossly intact, tongue protrudes midline, no fasiculations noted.  Motor: normal bulk and tone Severely limited active ROM in R shoulder, able to passively move almost full ROM (they note this is chronic but unclear etiology)  Reflexes: symmetrical, bilat downgoing toes  Sens: LT intact in all extremities  Brief Motor UPDRS  Speech: mild tremor in speech  Facial Expression: mild masked facies, decreased bilat blink  Tremor: Rest: noted pill rolling rest tremor R: 3 L:  2  Action/postural R: unable to test L: 1...noted re-emergent tremor  Rigidity: Mild cogwheeling bilat UE  Finger taps:   R:2 L:2  Open/close hands: R:2 L:1  Foot taps: R:3 L:3  Arising from Chair: 3 Gait/FOG: Requires assistance or walker, noted FOG, slow, stooped posture, re-emergent tremor with standing    Assessment:  After physical and neurologic examination, review of laboratory studies, imaging, neurophysiology testing and pre-existing records, assessment will be reviewed on the problem list.  Plan:  Treatment plan and additional workup will be reviewed under Problem List.  Ms Yera is a pleasant 79y/o woman with a history of tremor since her 58s. Per the patient the symptoms began as a right sided predominantly rest tremor that slowly progressed to involve bilateral upper extremities and a rest-action-postural component. She also notes generalized bradykinesia and gait instability. Has been tried on Primidone for ET with no noted benefit.   Based on her physical exam and clinical history I feel this is most consistent with a diagnosis of parkinsonism. With age and overall slow progression is most likely a case of idiopathic-PD. There is a strong overlap between PD and essential tremor and she does share components of both.   The above was discussed extensively with the patient and her daughter-in law. After discussion of potential therapeutic options we decided to proceed with a trial of Sinemet 25-100.  1) Tremor: suspect likely due to underlying parkinsonism though there does appear to be an ET component as well -will start Sinemet 25-100 with titration up to 1 tablet three times a day. Can titrate up further in the future as tolerated/indicated -will discontinue Primidone at this time (patient notes it ran out and has not been taking it at this time anyway) -will likely benefit from Physical therapy for gait training in the future  2)R shoulder decreased ROM:  unclear etiology -will give referral for physical therapy to work on strengthening and improving ROM  Follow up  in 2 to 3 months or early as needed  A total of was spent with this patient. Over half the time was spent in direct face to face consulation of the above diagnosis. We extensively discussed the possible causes of her symptoms and why it fit more with parkinsonism. Different treatment options were discussed. All questions were fully answered.    I have read the note, and I agree with the clinical assessment and plan.  Lesly Dukes

## 2012-09-19 NOTE — Patient Instructions (Addendum)
Overall you are doing fairly well but I do want to suggest a few things today:   Your findings are most consistent with a diagnosis of parkinsonism, the most likely cause of this is idiopathic Parkinsons disease. There is a strong overlap between essential tremor and Parkinson's and you share some features of both  As far as your medications are concerned, I would like to suggest starting Sinemet 25-100. Please take 1/2 tablet three times a day at 8am, noon, 4pm. Do this for 1 week and then increase to 1 tablet three times a day at 8am, noon and 4pm. Please take this medication before or after eating -Discontinue the primidone at this time  I would like to refer you to physical therapy to work on your shoulder.   I would like to see you back in 2 to 3 months, sooner if we need to. Please call us with any interim questions, concerns, problems, updates or refill requests.   My clinical assistant and will answer any of your questions and relay your messages to me and also relay most of my messages to you.   Our phone number is [phone]. We also have an after hours call service for urgent matters and there is a physician on-call for urgent questions. For any emergencies you know to call 911 or go to the nearest emergency room

## 2012-09-25 ENCOUNTER — Telehealth: Payer: Self-pay | Admitting: Neurology

## 2012-09-25 NOTE — Telephone Encounter (Signed)
Called pt about r/s time to change from 11:30 to 3:00, pt states she called earlier about seeing if Dr. Hosie Poisson was going to set up Physical Therapy for her right shoulder, whoever she spoke with I don't see any notes about this so I advised her I would send to triage and have them call her about this. Pt states she comes to see him about her shaking and she fell on her shoulder. Pt needs someone to call her concerning this matter, I did see where there is a new referral out. Thanks

## 2012-09-26 NOTE — Telephone Encounter (Signed)
Andrey Campanile, this patient is calling about her PT referral. I see a new order in the system from 09-19-12 but do not know if it has been sent. Please advise for me thanks.

## 2012-09-27 ENCOUNTER — Encounter: Payer: Self-pay | Admitting: Cardiology

## 2012-09-27 ENCOUNTER — Ambulatory Visit (INDEPENDENT_AMBULATORY_CARE_PROVIDER_SITE_OTHER): Payer: Medicare Other | Admitting: Cardiology

## 2012-09-27 VITALS — BP 118/60 | HR 53 | Ht 59.0 in | Wt 118.0 lb

## 2012-09-27 DIAGNOSIS — I472 Ventricular tachycardia: Secondary | ICD-10-CM

## 2012-09-27 DIAGNOSIS — I428 Other cardiomyopathies: Secondary | ICD-10-CM

## 2012-09-27 DIAGNOSIS — I4891 Unspecified atrial fibrillation: Secondary | ICD-10-CM

## 2012-09-27 DIAGNOSIS — I5022 Chronic systolic (congestive) heart failure: Secondary | ICD-10-CM

## 2012-09-27 DIAGNOSIS — I442 Atrioventricular block, complete: Secondary | ICD-10-CM

## 2012-09-27 NOTE — Assessment & Plan Note (Signed)
This appears to be new and is asymptomatic. She is artery on amiodarone for history of ventricular tachycardia and is totally paced today. She is high fall risk with her Parkinson's disease and general frail status. I would not recommend Coumadin or anticoagulation. I've advised her to take aspirin 325 mg a day. We'll have scheduled the return to the device clinic.

## 2012-09-27 NOTE — Progress Notes (Signed)
HPI [name] comes in today for evaluation and management of her nonischemic cardiomyopathy, chronic systolic heart failure, history of ventricular tachycardia and AV block status post pacemaker and defibrillator.  She is very frail and walks with a walker. Her daughter-in-law is with her. She does not have any specific cardiac complaints he just feels bad in general. She did notice significant swelling of her ankles and feet after eating bad food as [place] the [place]. It tends to go down at night. She denies orthopnea or PND. She is on spironolactone.  Past Medical History  Diagnosis Date  . Glaucoma   . CHF (congestive heart failure)   . HTN (hypertension)   . Tremor     sees Dr. Gerilyn Pilgrim, neurologist  . Arthritis     left hip-degeneration  . Hip pain, left   . Paroxysmal ventricular tachycardia 09/23/2008    Centricity Description: VENTRICULAR TACHYCARDIA Qualifier: Diagnosis of  By: Via LPN, Larita Fife   Centricity Description: PAROXYSMAL VENTRICULAR TACHYCARDIA Qualifier: Diagnosis of  By: Via LPN, Larita Fife    . Nonischemic cardiomyopathy 09/23/2008    Cardiac catheterization in 2011: EF of 25%; normal coronary angiography.    . Automatic implantable cardiac defibrillator in situ 09/23/2008    Appropriate discharges in 2011 and 01/2012   . Dizziness 01/15/2012  . UTI (urinary tract infection) 01/15/2012  . Anxiety   . Hyperlipidemia   . Osteoporosis   . Pacemaker   . Cardiac defibrillator in place   . Shoulder joint pain     difficulty raising arm-right  . High cholesterol   . Heart disease   . A-fib     Current Outpatient Prescriptions  Medication Sig Dispense Refill  . alendronate (FOSAMAX) 70 MG tablet Take 1 tablet (70 mg total) by mouth every 7 (seven) days. Take with a full glass of water on an empty stomach.  4 tablet  11  . amiodarone (PACERONE) 200 MG tablet Take 1 tablet (200 mg total) by mouth 2 (two) times daily.  60 tablet  6  . benazepril (LOTENSIN) 10 MG tablet  Take 1 tablet (10 mg total) by mouth daily.  30 tablet  6  . Calcium Carbonate-Vitamin D (CALCIUM 600 + D PO) Take 1 tablet by mouth daily.      . carbidopa-levodopa (SINEMET IR) 25-100 MG per tablet Take 1/2 tablet tid for 7 days then increase to 1 tablet tid  90 tablet  3  . carvedilol (COREG) 3.125 MG tablet Take 1 tablet (3.125 mg total) by mouth 2 (two) times daily with a meal.  60 tablet  3  . Cholecalciferol (VITAMIN D-3) 1000 UNITS CAPS Take by mouth daily.      Marland Kitchen HYDROcodone-acetaminophen (NORCO/VICODIN) 5-325 MG per tablet Take 1 tablet by mouth every 4 (four) hours as needed.       . Multiple Vitamin (MULITIVITAMIN WITH MINERALS) TABS Take 1 tablet by mouth every morning.      . naproxen (NAPROSYN) 500 MG tablet Take 1 tablet by mouth 2 (two) times daily.      . Omega-3 Fatty Acids (FISH OIL PO) Take by mouth daily.      . polyethylene glycol (MIRALAX / GLYCOLAX) packet Take 17 g by mouth daily as needed (Constipation).      . primidone (MYSOLINE) 50 MG tablet Take 1 tablet (50 mg total) by mouth 3 (three) times daily.  90 tablet  11  . spironolactone (ALDACTONE) 25 MG tablet Take 1 tablet (25 mg  total) by mouth daily.  30 tablet  3   No current facility-administered medications for this visit.    Allergies  Allergen Reactions  . Lipitor [Atorvastatin]     Leg pain  . Pravachol [Pravastatin Sodium]     Headache  . Zetia [Ezetimibe]     Afraid    Family History  Problem Relation Age of Onset  . Coronary artery disease      family history; female <65  . Cancer Father     Lung  . Asthma Mother     History   Social History  . Marital Status: Widowed    Spouse Name: N/A    Number of Children: 2  . Years of Education: 12   Occupational History  . not employed     resides alone   Social History Main Topics  . Smoking status: Never Smoker   . Smokeless tobacco: Never Used     Comment: tobacco - use  . Alcohol Use: No  . Drug Use: No  . Sexual Activity: Not on  file   Other Topics Concern  . Not on file   Social History Narrative   Widowed, retired, does not get regular exercise.     ROS ALL NEGATIVE EXCEPT THOSE NOTED IN HPI  PE  General Appearance: well developed, well nourished in no acute distress, frail, resting tremor HEENT: symmetrical face, PERRLA, good dentition  Neck: no JVD, thyromegaly, or adenopathy, trachea midline Chest: symmetric without deformity Cardiac: PMI displaced inferolaterally, RRR, normal S1, S2, no gallop or murmur Lung: clear to ausculation and percussion Vascular: all pulses full without bruits  Abdominal: nondistended, nontender, good bowel sounds, no HSM, no bruits Extremities: no cyanosis, clubbing or edema, no sign of DVT, no varicosities  Skin: normal color, no rashes Neuro: alert and oriented x 3, non-focal Pysch: normal affect  EKG Ventricular paced with underlying atrial fibrillation. No change from previous ECG. BMET    Component Value Date/Time   NA 138 01/31/2012 1104   K 4.5 01/31/2012 1104   CL 99 01/31/2012 1104   CO2 29 01/31/2012 1104   GLUCOSE 63* 01/31/2012 1104   BUN 19 01/31/2012 1104   CREATININE 0.90 01/31/2012 1104   CREATININE 0.71 01/18/2012 0423   CALCIUM 9.9 01/31/2012 1104   GFRNONAA 76* 01/18/2012 0423   GFRAA 88* 01/18/2012 0423    Lipid Panel     Component Value Date/Time   CHOL 230* 05/21/2012 1203   TRIG 129 05/21/2012 1203   HDL 64 05/21/2012 1203   CHOLHDL 3.6 05/21/2012 1203   VLDL 26 05/21/2012 1203   LDLCALC 140* 05/21/2012 1203    CBC    Component Value Date/Time   WBC 8.3 01/16/2012 0529   RBC 3.93 01/16/2012 0529   HGB 12.1 01/16/2012 0529   HCT 36.6 01/16/2012 0529   PLT 271 01/16/2012 0529   MCV 93.1 01/16/2012 0529   MCH 30.8 01/16/2012 0529   MCHC 33.1 01/16/2012 0529   RDW 13.9 01/16/2012 0529   LYMPHSABS 1.7 01/15/2012 1342   MONOABS 0.5 01/15/2012 1342   EOSABS 0.3 01/15/2012 1342   BASOSABS 0.1 01/15/2012 1342

## 2012-09-27 NOTE — Assessment & Plan Note (Signed)
Stable when she is careful l with her diet. Reinforced salt restriction. Also advised to call the office if she begins to get progressive edema that does not go down at night. She is not on Lasix. We'll hold this for now. She'll continue with spironolactone.

## 2012-09-27 NOTE — Patient Instructions (Addendum)
Your physician recommends that you schedule a follow-up appointment in: 6 Months with Dr Reggy Eye will receive a reminder letter two months in advance reminding you to call and schedule your appointment. If you don't receive this letter, please contact our office.  Your physician has recommended you make the following change in your medication:  1. Start taking aspirin EC 325 mg daily

## 2012-10-01 ENCOUNTER — Telehealth: Payer: Self-pay | Admitting: Family Medicine

## 2012-10-01 NOTE — Telephone Encounter (Signed)
We do not take over orders like this because the doc who orders it is supposed rto review results and manage follow up

## 2012-10-01 NOTE — Telephone Encounter (Signed)
Laney Potash from [facility] called to let us know that the pt's daughter handed her an order for PT, Victorino Dike states our name is on the order and it looks like pt needs out patient PT, I looked in Epic chart and it looks like Dr. Hosie Poisson @ [facility] recommended this PT for pt's shoulder.  How do we need to handle this?  Order it from here or call [facility] ([phone])?  Please advise

## 2012-10-03 NOTE — Telephone Encounter (Signed)
Notified Laney Potash at [facility].

## 2012-10-04 ENCOUNTER — Telehealth: Payer: Self-pay | Admitting: Neurology

## 2012-10-04 NOTE — Telephone Encounter (Signed)
Joni Reining with [facility] left a message that the patient's daughter in law gave them a referral for PT, but something happened to it and they would like it refaxed.  Then another message was left from [facility] that the patient was referred for outpatient therapy.  I faxed over information again, and will wait and see if patient is to have home health or outpatient therapy.

## 2012-10-22 ENCOUNTER — Telehealth: Payer: Self-pay | Admitting: Family Medicine

## 2012-10-22 MED ORDER — ONDANSETRON HCL 4 MG PO TABS: 4.0000 mg | ORAL_TABLET | Freq: Four times a day (QID) | ORAL | Status: DC | PRN

## 2012-10-22 NOTE — Telephone Encounter (Signed)
Patient says she would like someone to call her back regarding the med she is taking that is making her nauseous-her specialist prescribed her this in August  Carbidopa/lezodopa 1 tab 3x daily

## 2012-10-22 NOTE — Telephone Encounter (Signed)
Specialist prescribed carbidopa/lezodopa one tid in august. The medicine is causing her to have nausea. Can something be called into [facility] for nausea

## 2012-10-22 NOTE — Telephone Encounter (Signed)
Sent in zofran--not odt. 4 mg. 30 one q 6hrs prn one ref to [facility]. Patient was notified.

## 2012-10-22 NOTE — Telephone Encounter (Signed)
zofran--not odt. 4 mg.  30 one q 6hrs prn one ref

## 2012-10-24 DIAGNOSIS — I509 Heart failure, unspecified: Secondary | ICD-10-CM

## 2012-10-24 DIAGNOSIS — Z5189 Encounter for other specified aftercare: Secondary | ICD-10-CM

## 2012-10-24 DIAGNOSIS — I498 Other specified cardiac arrhythmias: Secondary | ICD-10-CM

## 2012-10-24 DIAGNOSIS — I10 Essential (primary) hypertension: Secondary | ICD-10-CM

## 2012-10-24 DIAGNOSIS — R52 Pain, unspecified: Secondary | ICD-10-CM

## 2012-10-25 ENCOUNTER — Ambulatory Visit (INDEPENDENT_AMBULATORY_CARE_PROVIDER_SITE_OTHER): Payer: Medicare Other | Admitting: Internal Medicine

## 2012-10-25 ENCOUNTER — Encounter: Payer: Self-pay | Admitting: Internal Medicine

## 2012-10-25 VITALS — BP 100/68 | HR 52 | Ht 59.0 in | Wt 115.0 lb

## 2012-10-25 DIAGNOSIS — I472 Ventricular tachycardia, unspecified: Secondary | ICD-10-CM

## 2012-10-25 DIAGNOSIS — I10 Essential (primary) hypertension: Secondary | ICD-10-CM

## 2012-10-25 DIAGNOSIS — I442 Atrioventricular block, complete: Secondary | ICD-10-CM

## 2012-10-25 DIAGNOSIS — I5022 Chronic systolic (congestive) heart failure: Secondary | ICD-10-CM

## 2012-10-25 DIAGNOSIS — I4891 Unspecified atrial fibrillation: Secondary | ICD-10-CM

## 2012-10-25 DIAGNOSIS — Z95810 Presence of automatic (implantable) cardiac defibrillator: Secondary | ICD-10-CM

## 2012-10-25 LAB — ICD DEVICE OBSERVATION
AL AMPLITUDE: 2 mv
AL THRESHOLD: 1.2 V
ATRIAL PACING ICD: 51 pct
DEVICE MODEL ICD: 583543
LV LEAD IMPEDENCE ICD: 558 Ohm
LV LEAD THRESHOLD: 1.9 V
RV LEAD THRESHOLD: 1.1 V
TZAT-0001FASTVT: 1
TZAT-0001FASTVT: 2
TZAT-0013FASTVT: 2
TZAT-0018FASTVT: NEGATIVE
TZST-0001FASTVT: 4
TZST-0001FASTVT: 5
TZST-0001FASTVT: 8
TZST-0003FASTVT: 31 J
TZST-0003FASTVT: 41 J
TZST-0003FASTVT: 41 J
VENTRICULAR PACING ICD: 98 pct

## 2012-10-25 NOTE — Patient Instructions (Addendum)
Your physician recommends that you schedule a follow-up appointment in: 6 months  

## 2012-10-25 NOTE — Assessment & Plan Note (Signed)
Her blood pressure is well controlled. No change in medical therapy. 

## 2012-10-25 NOTE — Assessment & Plan Note (Signed)
The patient's [employer] device is working normally. We'll plan to recheck in several months.

## 2012-10-25 NOTE — Progress Notes (Signed)
HPI [name] returns today for followup. She is a very pleasant elderly woman with a nonischemic cardiomyopathy, chronic systolic heart failure, ventricular tachycardia, complete heart block, status post biventricular ICD implantation. In the interim, her arrhythmias have been well-controlled on amiodarone. She is received no ICD shock. She has been diagnosed with Parkinson's disease. In addition, she has severe arthritis in her leg and weakness in her right shoulder after a shoulder injury. She has not felt to be a candidate for elective shoulder or hip replacement surgery Allergies  Allergen Reactions  . Lipitor [Atorvastatin]     Leg pain  . Pravachol [Pravastatin Sodium]     Headache  . Zetia [Ezetimibe]     Afraid     Current Outpatient Prescriptions  Medication Sig Dispense Refill  . amiodarone (PACERONE) 200 MG tablet Take 1 tablet (200 mg total) by mouth 2 (two) times daily.  60 tablet  6  . aspirin 325 MG EC tablet Take 325 mg by mouth daily.      . benazepril (LOTENSIN) 10 MG tablet Take 1 tablet (10 mg total) by mouth daily.  30 tablet  6  . Calcium Carbonate-Vitamin D (CALCIUM 600 + D PO) Take 1 tablet by mouth daily.      . carbidopa-levodopa (SINEMET IR) 25-100 MG per tablet Take 1/2 tablet tid for 7 days then increase to 1 tablet tid  90 tablet  3  . carvedilol (COREG) 3.125 MG tablet Take 1 tablet (3.125 mg total) by mouth 2 (two) times daily with a meal.  60 tablet  3  . Cholecalciferol (VITAMIN D-3) 1000 UNITS CAPS Take 1,000 Units by mouth 2 (two) times daily.       Marland Kitchen HYDROcodone-acetaminophen (NORCO/VICODIN) 5-325 MG per tablet Take 1 tablet by mouth every 4 (four) hours as needed.       . Multiple Vitamin (MULITIVITAMIN WITH MINERALS) TABS Take 1 tablet by mouth every morning.      . naproxen (NAPROSYN) 500 MG tablet Take 1 tablet by mouth 2 (two) times daily.      . Omega-3 Fatty Acids (FISH OIL PO) Take 1 g by mouth daily.       . ondansetron (ZOFRAN) 4 MG tablet  Take 1 tablet (4 mg total) by mouth every 6 (six) hours as needed for nausea.  30 tablet  1  . polyethylene glycol (MIRALAX / GLYCOLAX) packet Take 17 g by mouth daily as needed (Constipation).      Marland Kitchen spironolactone (ALDACTONE) 25 MG tablet Take 1 tablet (25 mg total) by mouth daily.  30 tablet  3   No current facility-administered medications for this visit.     Past Medical History  Diagnosis Date  . Glaucoma   . CHF (congestive heart failure)   . HTN (hypertension)   . Tremor     sees Dr. Gerilyn Pilgrim, neurologist  . Arthritis     left hip-degeneration  . Hip pain, left   . Paroxysmal ventricular tachycardia 09/23/2008    Centricity Description: VENTRICULAR TACHYCARDIA Qualifier: Diagnosis of  By: Via LPN, Larita Fife   Centricity Description: PAROXYSMAL VENTRICULAR TACHYCARDIA Qualifier: Diagnosis of  By: Via LPN, Larita Fife    . Nonischemic cardiomyopathy 09/23/2008    Cardiac catheterization in 2011: EF of 25%; normal coronary angiography.    . Automatic implantable cardiac defibrillator in situ 09/23/2008    Appropriate discharges in 2011 and 01/2012   . Dizziness 01/15/2012  . UTI (urinary tract infection) 01/15/2012  . Anxiety   .  Hyperlipidemia   . Osteoporosis   . Pacemaker   . Cardiac defibrillator in place   . Shoulder joint pain     difficulty raising arm-right  . High cholesterol   . Heart disease   . A-fib     ROS:   All systems reviewed and negative except as noted in the HPI.   Past Surgical History  Procedure Laterality Date  . Icd implantation  2003    [employer]; remote - no  . Ankle fracture surgery  1992    left  . Bladder suspension  1994  . Abdominal hysterectomy  1973    partial  . Tonsillectomy  ages 87 and 8    [place]  . Cataract extraction w/phaco  06/15/2011    Procedure: CATARACT EXTRACTION PHACO AND INTRAOCULAR LENS PLACEMENT (IOC);  Surgeon: Gemma Payor, MD;  Location: [facility];  Service: Ophthalmology;  Laterality: Right;  CDE:20.26  . Cataract  extraction w/phaco  09/14/2011    Procedure: CATARACT EXTRACTION PHACO AND INTRAOCULAR LENS PLACEMENT (IOC);  Surgeon: Gemma Payor, MD;  Location: [facility];  Service: Ophthalmology;  Laterality: Left;  CDE 31.96  . Heart  surgery  2003    gadgets implant  . Ankle surgery  1992    broken      Family History  Problem Relation Age of Onset  . Coronary artery disease      family history; female <65  . Cancer Father     Lung  . Asthma Mother      History   Social History  . Marital Status: Widowed    Spouse Name: N/A    Number of Children: 2  . Years of Education: 12   Occupational History  . not employed     resides alone   Social History Main Topics  . Smoking status: Never Smoker   . Smokeless tobacco: Never Used     Comment: tobacco - use  . Alcohol Use: No  . Drug Use: No  . Sexual Activity: Not on file   Other Topics Concern  . Not on file   Social History Narrative   Widowed, retired, does not get regular exercise.      BP 100/68  Pulse 52  Ht 4\' 11"  (1.499 m)  Wt 115 lb (52.164 kg)  BMI 23.21 kg/m2  Physical Exam:  Chronically ill appearing elderly woman, with an intention tremor, NAD HEENT: Unremarkable Neck:  7 cmJVD, no thyromegally Back:  No CVA tenderness Lungs:  Clear with no wheezes, rales, or rhonchi. Well-healed ICD incision. HEART:  Regular rate rhythm, no murmurs, no rubs, no clicks Abd:  soft, positive bowel sounds, no organomegally, no rebound, no guarding Ext:  2 plus pulses, no edema, no cyanosis, no clubbing Skin:  No rashes no nodules Neuro:  CN II through XII intact, motor grossly intact   DEVICE  Normal device function.  See PaceArt for details.   Assess/Plan:

## 2012-10-25 NOTE — Assessment & Plan Note (Signed)
The patient has had no recurrent ventricular arrhythmias on amiodarone 400 mg daily. While I considered reducing her dose of amiodarone today, I think he would be most appropriate to do this when I see her back in 6 months, if she remains arrhythmia free.

## 2012-10-29 ENCOUNTER — Encounter: Payer: Self-pay | Admitting: Internal Medicine

## 2012-11-01 ENCOUNTER — Telehealth: Payer: Self-pay | Admitting: Family Medicine

## 2012-11-01 MED ORDER — ONDANSETRON HCL 8 MG PO TABS: 8.0000 mg | ORAL_TABLET | Freq: Four times a day (QID) | ORAL | Status: DC | PRN

## 2012-11-01 NOTE — Telephone Encounter (Signed)
Change to zofran 8 mg one q 6 to 8 hours #25 with 2 refills, f-u if ongoing

## 2012-11-01 NOTE — Telephone Encounter (Signed)
Notified patient change to zofran 8 mg one q 6 to 8 hours #25 with 2 refills, f-u if ongoing. Med sent into [facility]. Patient verbalized understanding.

## 2012-11-01 NOTE — Telephone Encounter (Signed)
Patient is taking ondansetron (ZOFRAN) 4 MG tablet for nausea.  However, this medication is not lasting six hours.  Can the times to be taken be increased for ondansetron (ZOFRAN) 4 MG tablet?  Please call Patient. Thanks

## 2012-11-06 ENCOUNTER — Ambulatory Visit (INDEPENDENT_AMBULATORY_CARE_PROVIDER_SITE_OTHER): Payer: Medicare Other | Admitting: Family Medicine

## 2012-11-06 ENCOUNTER — Encounter: Payer: Self-pay | Admitting: Family Medicine

## 2012-11-06 VITALS — BP 110/68 | Ht 59.0 in | Wt 115.0 lb

## 2012-11-06 DIAGNOSIS — M25519 Pain in unspecified shoulder: Secondary | ICD-10-CM

## 2012-11-06 DIAGNOSIS — G8929 Other chronic pain: Secondary | ICD-10-CM

## 2012-11-06 NOTE — Progress Notes (Signed)
  Subjective:    Patient ID: [name], female    DOB: 1923/04/06, 79 y.o.   MRN: [id number]  HPI  Patient arrives requesting referral to ortho for continued right shoulder pain.  See prior note experienced an injury to her shoulder. Pain persists. Has been doing physical therapy initiated through the neurologist. Physical therapist thought it is time for her to see an orthopedic doctor.  Review of Systems No chest pain no back pain no shortness of breath ROS otherwise negative    Objective:   Physical Exam Alert some discomfort with exam. Lungs clear. Heart regular in rhythm. Vital stable. Positive limitation extension shoulder diffuse tenderness to deep palpation some deltoid tenderness       Assessment & Plan:  Impression persistent shoulder pain with question early element of frozen shoulder plan continue physical therapy or so consult as requested. No further medications at this time. WSL

## 2012-11-19 ENCOUNTER — Encounter: Payer: Self-pay | Admitting: Orthopedic Surgery

## 2012-11-19 ENCOUNTER — Ambulatory Visit (INDEPENDENT_AMBULATORY_CARE_PROVIDER_SITE_OTHER): Payer: Medicare Other | Admitting: Orthopedic Surgery

## 2012-11-19 VITALS — BP 97/55 | Ht 59.0 in | Wt 112.0 lb

## 2012-11-19 DIAGNOSIS — S46909A Unspecified injury of unspecified muscle, fascia and tendon at shoulder and upper arm level, unspecified arm, initial encounter: Secondary | ICD-10-CM

## 2012-11-19 DIAGNOSIS — M25511 Pain in right shoulder: Secondary | ICD-10-CM | POA: Insufficient documentation

## 2012-11-19 DIAGNOSIS — S46002A Unspecified injury of muscle(s) and tendon(s) of the rotator cuff of left shoulder, initial encounter: Secondary | ICD-10-CM

## 2012-11-19 DIAGNOSIS — S46009A Unspecified injury of muscle(s) and tendon(s) of the rotator cuff of unspecified shoulder, initial encounter: Secondary | ICD-10-CM | POA: Insufficient documentation

## 2012-11-19 DIAGNOSIS — M75101 Unspecified rotator cuff tear or rupture of right shoulder, not specified as traumatic: Secondary | ICD-10-CM

## 2012-11-19 DIAGNOSIS — S4980XA Other specified injuries of shoulder and upper arm, unspecified arm, initial encounter: Secondary | ICD-10-CM

## 2012-11-19 DIAGNOSIS — M25519 Pain in unspecified shoulder: Secondary | ICD-10-CM

## 2012-11-19 DIAGNOSIS — M75100 Unspecified rotator cuff tear or rupture of unspecified shoulder, not specified as traumatic: Secondary | ICD-10-CM | POA: Insufficient documentation

## 2012-11-19 DIAGNOSIS — S43429A Sprain of unspecified rotator cuff capsule, initial encounter: Secondary | ICD-10-CM

## 2012-11-19 NOTE — Progress Notes (Signed)
Patient ID: [name], female   DOB: May 26, 1923, 79 y.o.   MRN: [id number]   Chief Complaint  Patient presents with  . Shoulder Pain    Left shoulder pain. Referral from Dr. Lubertha South    History this patient fell on 06/15/2012 injured her right shoulder and right arm underwent occupational therapy presents now with sharp pain 1/10 comes and goes worse with movement. At initial injury had some bruising around the arm. She's 79 years old  Review of systems positive for fatigue and wearing of the eyes GI complaints tremors easy bruising joint pain muscle pain  The past, family history and social history have been reviewed and are recorded in the corresponding sections of epic   Physical Exam(12)  Vital signs: BP 97/55  Ht 4\' 11"  (1.499 m)  Wt 112 lb (50.803 kg)  BMI 22.61 kg/m2   1.GENERAL: Overall appearance shows a frail elderly female very thin 2. CDV: pulses are normal in the right upper extremity  3. Skin: normal over the right shoulder with a small abrasion non-cellulitic  4. Lymph: nodes were not palpable/normal axilla and cervical region  5/6. Psychiatric: awake, alert and oriented, mood and affect normal   7. Neuro: normal sensation right arm  9.   Inspection no tenderness around the shoulder clavicle a.c. joint, tenderness in the shoulder joint line 10. Range of Motion passive flexion is nearly normal as is passive abduction and external rotation but active range of motion shows pseudoparalysis 11. Motor in internal/external rotation strength is normal supraspinatus 0/5 with a positive drop test 12. Stability normal right shoulder  She has kyphosis of the thoracic spine no tenderness in the cervical spine   Imaging x-ray did not show any fracture Assessment: Rotator cuff tear right shoulder with functional disability but minimal pain    Plan: Secondary to her heart condition without surgery is possible  Gave injection  Shoulder Injection Procedure  Note   Pre-operative Diagnosis: right  RC Syndrome  Post-operative Diagnosis: same  Indications: pain   Anesthesia: ethyl chloride   Procedure Details   Verbal consent was obtained for the procedure. The shoulder was prepped withalcohol and the skin was anesthetized. A 20 gauge needle was advanced into the subacromial space through posterior approach without difficulty  The space was then injected with 3 ml 1% lidocaine and 1 ml of depomedrol. The injection site was cleansed with isopropyl alcohol and a dressing was applied.  Complications:  None; patient tolerated the procedure well.

## 2012-11-19 NOTE — Patient Instructions (Signed)
Joint Injection  Care After  Refer to this sheet in the next few days. These instructions provide you with information on caring for yourself after you have had a joint injection. Your caregiver also may give you more specific instructions. Your treatment has been planned according to current medical practices, but problems sometimes occur. Call your caregiver if you have any problems or questions after your procedure.  After any type of joint injection, it is not uncommon to experience:  Soreness, swelling, or bruising around the injection site.  Mild numbness, tingling, or weakness around the injection site caused by the numbing medicine used before or with the injection. It also is possible to experience the following effects associated with the specific agent after injection:  Iodine-based contrast agents:  Allergic reaction (itching, hives, widespread redness, and swelling beyond the injection site).  Corticosteroids (These effects are rare.):  Allergic reaction.  Increased blood sugar levels (If you have diabetes and you notice that your blood sugar levels have increased, notify your caregiver).  Increased blood pressure levels.  Mood swings.  Hyaluronic acid in the use of viscosupplementation.  Temporary heat or redness.  Temporary rash and itching.  Increased fluid accumulation in the injected joint. These effects all should resolve within a day after your procedure.  HOME CARE INSTRUCTIONS  Limit yourself to light activity the day of your procedure. Avoid lifting heavy objects, bending, stooping, or twisting.  Take prescription or over-the-counter pain medication as directed by your caregiver.  You may apply ice to your injection site to reduce pain and swelling the day of your procedure. Ice may be applied 3-4 times:  Put ice in a plastic bag.  Place a towel between your skin and the bag.  Leave the ice on for no longer than 15-20 minutes each time. SEEK IMMEDIATE MEDICAL CARE IF:   Pain and swelling get worse rather than better or extend beyond the injection site.  Numbness does not go away.  Blood or fluid continues to leak from the injection site.  You have chest pain.  You have swelling of your face or tongue.  You have trouble breathing or you become dizzy.  You develop a fever, chills, or severe tenderness at the injection site that last longer than 1 day. MAKE SURE YOU:  Understand these instructions.  Watch your condition.  Get help right away if you are not doing well or if you get worse. Document Released: 10/13/2010 Document Revised: 04/24/2011 Document Reviewed: 10/13/2010  [facility] Patient Information 2014 [place], [place].  Rotator Cuff Tear The rotator cuff is four tendons that assist in the motion of the shoulder. A rotator cuff tear is a tear in one of these four tendons. It is characterized by pain and weakness of the shoulder. The rotator cuff tendons surround the shoulder ball and socket joint (humeral head). The rotator cuff tendons attach to the shoulder blade (scapula) on one side and the upper arm bone (humerus) on the other side. The rotator cuff is essential for shoulder stability and shoulder motion. SYMPTOMS   Pain around the shoulder, often at the outer portion of the upper arm.  Pain that is worse with shoulder function, especially when reaching overhead or lifting.  Weakness of the shoulder muscles.  Aching when not using your arm; often, pain awakens you at night, especially when sleeping on the affected side.  Tenderness, swelling, warmth, or redness over the outer aspect of the shoulder.  Loss of strength.  Limited motion of the shoulder,  especially reaching behind (reaching into one's back pocket) or across your body.  A crackling sound (crepitation) when moving the shoulder.  Biceps tendon pain (in the front of the shoulder) and inflammation, worse with bending the elbow or lifting. CAUSES   Strain from sudden increase in  amount or intensity of activity.  Direct blow or injury to the shoulder.  Aging, wear from from normal use.  Roof of the shoulder (acromial) spur. RISK INCREASES WITH:   Contact sports (football, wrestling, or boxing).  Throwing or hitting sports (baseball, tennis, or volleyball).  Weightlifting and bodybuilding.  Heavy labor.  Previous injury to rotator cuff.  Failure to warm up properly before activity.  Inadequate protective equipment.  Increasing age.  Spurring of the outer end of the scapula (acromion).  Cortisone injections.  Poor shoulder strength and flexibility. PREVENTION  Warm up and stretch properly before activity.  Allow time for rest and recovery between practices and competition.  Maintain physical fitness:  Cardiovascular fitness.  Shoulder flexibility.  Strength and endurance of the rotator cuff muscles and muscles of the shoulder blade.  Learn and use proper technique when throwing or hitting. PROGNOSIS Surgery is often needed. Although, symptoms may go away by themselves. RELATED COMPLICATIONS   Persistent pain that may progress to constant pain.  Shoulder stiffness, frozen shoulder syndrome, or loss of motion.  Recurrence of symptoms, especially if treated without surgery.  Inability to return to same level of sports, even with surgery.  Persistent weakness.  Risks of surgery, including infection, bleeding, injury to nerves, shoulder stiffness, weakness, re-tearing of the rotator cuff tendon.  Deltoid detachment, acromial fracture, and persistent pain. TREATMENT Treatment involves the use of ice and medicine to reduce pain and inflammation. Strengthening and stretching exercise are usually recommended. These exercises may be completed at home or with a therapist. You may also be instructed to modify offending activities. Corticosteroid injections may be given to reduce inflammation. Surgery is usually recommended for athletes.  Surgery has the best chance for a full recovery. Surgery involves:  Removal of an inflamed bursa.  Removal of an acromial spur if present.  Suturing the torn tendon back together. Rotator cuff surgeries may be preformed either arthroscopically or through an open incision. Recovery typically takes 6 to 12 months. MEDICATION  If pain medicine is necessary, then nonsteroidal anti-inflammatory medicines, such as aspirin and ibuprofen, or other minor pain relievers, such as acetaminophen, are often recommended.  Do not take pain medicine for 7 days before surgery.  Prescription pain relievers are usually only prescribed after surgery. Use only as directed and only as much as you need.  Corticosteroid injections may be given to reduce inflammation. However, there is a limited number of times the joint may be injected with these medicines. HEAT AND COLD  Cold treatment (icing) relieves pain and reduces inflammation. Cold treatment should be applied for 10 to 15 minutes every 2 to 3 hours for inflammation and pain and immediately after any activity that aggravates your symptoms. Use ice packs or massage the area with a piece of ice (ice massage).  Heat treatment may be used prior to performing the stretching and strengthening activities prescribed by your caregiver, physical therapist, or athletic trainer. Use a heat pack or soak the injury in warm water. SEEK MEDICAL CARE IF:   Symptoms get worse or do not improve in 4 to 6 weeks despite treatment.  You experience pain, numbness, or coldness in the hand.  Blue, gray, or dark color appears  in the fingernails.  New, unexplained symptoms develop (drugs used in treatment may produce side effects). Document Released: 01/30/2005 Document Revised: 04/24/2011 Document Reviewed: 05/14/2008 [facility] Patient Information 2014 [place], [place].

## 2012-11-26 ENCOUNTER — Ambulatory Visit (INDEPENDENT_AMBULATORY_CARE_PROVIDER_SITE_OTHER): Payer: Medicare Other | Admitting: Neurology

## 2012-11-26 ENCOUNTER — Encounter: Payer: Self-pay | Admitting: Neurology

## 2012-11-26 VITALS — BP 87/53 | HR 49 | Ht 59.02 in | Wt 111.0 lb

## 2012-11-26 DIAGNOSIS — G20 Parkinson's disease: Secondary | ICD-10-CM

## 2012-11-26 MED ORDER — CARBIDOPA-LEVODOPA 25-100 MG PO TABS: ORAL_TABLET | ORAL | Status: DC

## 2012-11-26 NOTE — Patient Instructions (Addendum)
Overall you are doing fairly well but I do want to suggest a few things today:   You are requesting to stop the Sinemet. I suggest you reconsider or try an alternative but if you wish to go off it please follow the following schedule to discontinue it safely: -1/2 tablet 3 times a day for 7 days then -1/2 tablet twice a day for 3 days then -1/2 tablet once a day for 3 days then discontinue  In the future we can try a medication called Neupro patch if you are willing.   As far as diagnostic testing:  We can consider a DAT scan in the future to confirm diagnosis  My clinical assistant and will answer any of your questions and relay your messages to me and also relay most of my messages to you.   Our phone number is [phone]. We also have an after hours call service for urgent matters and there is a physician on-call for urgent questions. For any emergencies you know to call 911 or go to the nearest emergency room

## 2012-11-26 NOTE — Progress Notes (Signed)
[facility]   Provider:  Dr Hosie Poisson Referring Provider: Merlyn Albert, MD Primary Care Physician:  [name] Asa, MD  CC:  PD follow up  HPI:  [name] is a 79 y.o. female here as a follow up of his Parkinson's disease  Patient was started on Sinemet 25 100 3 times a day at last visit, was also given referral for physical therapy for limited shoulder motion and pain. Evaluate physical therapy and orthopedic surgery found to have a potentially torn rotator cuff, had injections with no benefit. Feels she is not getting any benefit from the Sinemet. Based on her response is unclear she has been taking on a regular basis 3 times a day. She notes that today she has only taken a one time. Is having nausea, unsure if it is occurring when she is taking the medication though. Does not notice any benefit or change in her tremor. Daughter who is present also does not notice any benefit. She has come off the primidone has not noticed any change in her tremor, this medication. Continues to have slow unsteady gait, is using a walker. Has not had any falls since last visit.  She wishes to come off the Sinemet, states that she plans to stop it cold [place].   Out of a complete 14 system review, the patient complains of only the following symptoms, and all other reviewed systems are negative. Positive for diarrhea constipation tremor weakness change in appetite fatigue  History   Social History  . Marital Status: Widowed    Spouse Name: N/A    Number of Children: 2  . Years of Education: 12   Occupational History  . not employed     resides alone   Social History Main Topics  . Smoking status: Never Smoker   . Smokeless tobacco: Never Used     Comment: tobacco - use  . Alcohol Use: No  . Drug Use: No  . Sexual Activity: Not on file   Other Topics Concern  . Not on file   Social History Narrative   Widowed, retired, does not get regular exercise.     Family  History  Problem Relation Age of Onset  . Coronary artery disease      family history; female <65  . Cancer Father     Lung  . Asthma Mother     Past Medical History  Diagnosis Date  . Glaucoma   . CHF (congestive heart failure)   . HTN (hypertension)   . Tremor     sees Dr. Gerilyn Pilgrim, neurologist  . Arthritis     left hip-degeneration  . Hip pain, left   . Paroxysmal ventricular tachycardia 09/23/2008    Centricity Description: VENTRICULAR TACHYCARDIA Qualifier: Diagnosis of  By: Via LPN, Larita Fife   Centricity Description: PAROXYSMAL VENTRICULAR TACHYCARDIA Qualifier: Diagnosis of  By: Via LPN, Larita Fife    . Nonischemic cardiomyopathy 09/23/2008    Cardiac catheterization in 2011: EF of 25%; normal coronary angiography.    . Automatic implantable cardiac defibrillator in situ 09/23/2008    Appropriate discharges in 2011 and 01/2012   . Dizziness 01/15/2012  . UTI (urinary tract infection) 01/15/2012  . Anxiety   . Hyperlipidemia   . Osteoporosis   . Pacemaker   . Cardiac defibrillator in place   . Shoulder joint pain     difficulty raising arm-right  . High cholesterol   . Heart disease   . A-fib     Past Surgical  History  Procedure Laterality Date  . Icd implantation  2003    [employer]; remote - no  . Ankle fracture surgery  1992    left  . Bladder suspension  1994  . Abdominal hysterectomy  1973    partial  . Tonsillectomy  ages 77 and 73    [place]  . Cataract extraction w/phaco  06/15/2011    Procedure: CATARACT EXTRACTION PHACO AND INTRAOCULAR LENS PLACEMENT (IOC);  Surgeon: Gemma Payor, MD;  Location: [facility];  Service: Ophthalmology;  Laterality: Right;  CDE:20.26  . Cataract extraction w/phaco  09/14/2011    Procedure: CATARACT EXTRACTION PHACO AND INTRAOCULAR LENS PLACEMENT (IOC);  Surgeon: Gemma Payor, MD;  Location: [facility];  Service: Ophthalmology;  Laterality: Left;  CDE 31.96  . Heart  surgery  2003    gadgets implant  . Ankle surgery  1992    broken      Current Outpatient Prescriptions  Medication Sig Dispense Refill  . amiodarone (PACERONE) 200 MG tablet Take 1 tablet (200 mg total) by mouth 2 (two) times daily.  60 tablet  6  . aspirin 325 MG EC tablet Take 325 mg by mouth daily.      . benazepril (LOTENSIN) 10 MG tablet Take 1 tablet (10 mg total) by mouth daily.  30 tablet  6  . Calcium Carbonate-Vitamin D (CALCIUM 600 + D PO) Take 1 tablet by mouth daily.      . carbidopa-levodopa (SINEMET IR) 25-100 MG per tablet Take 1/2 tablet tid for 7 days then increase to 1 tablet tid  90 tablet  3  . carvedilol (COREG) 3.125 MG tablet Take 1 tablet (3.125 mg total) by mouth 2 (two) times daily with a meal.  60 tablet  3  . Cholecalciferol (VITAMIN D-3) 1000 UNITS CAPS Take 1,000 Units by mouth 2 (two) times daily.       Marland Kitchen HYDROcodone-acetaminophen (NORCO/VICODIN) 5-325 MG per tablet Take 1 tablet by mouth every 4 (four) hours as needed.       . Multiple Vitamin (MULITIVITAMIN WITH MINERALS) TABS Take 1 tablet by mouth every morning.      . naproxen (NAPROSYN) 500 MG tablet Take 1 tablet by mouth 2 (two) times daily.      . Omega-3 Fatty Acids (FISH OIL PO) Take 1 g by mouth daily.       . ondansetron (ZOFRAN) 8 MG tablet Take 1 tablet (8 mg total) by mouth every 6 (six) hours as needed for nausea.  25 tablet  2  . polyethylene glycol (MIRALAX / GLYCOLAX) packet Take 17 g by mouth daily as needed (Constipation).      Marland Kitchen spironolactone (ALDACTONE) 25 MG tablet Take 1 tablet (25 mg total) by mouth daily.  30 tablet  3   No current facility-administered medications for this visit.    Allergies as of 11/26/2012 - Review Complete 11/26/2012  Allergen Reaction Noted  . Lipitor [atorvastatin]  05/17/2012  . Pravachol [pravastatin sodium]  05/17/2012  . Zetia [ezetimibe]  05/17/2012    Vitals: BP 87/53  Pulse 49  Ht 4' 11.02" (1.499 m)  Wt 111 lb (50.349 kg)  BMI 22.41 kg/m2 Last Weight:  Wt Readings from Last 1 Encounters:  11/26/12 111  lb (50.349 kg)   Last Height:   Ht Readings from Last 1 Encounters:  11/26/12 4' 11.02" (1.499 m)     Physical exam: Exam: Gen: NAD, conversant Eyes: anicteric sclerae, moist conjunctivae HENT: Atraumati Lungs: CTA, no wheezing,  rales, rhonic                          CV: RRR, no MRG Abdomen: Soft, non-tender;  Extremities: No peripheral edema, erythema of bilateral feet Skin: Normal temperature, no rash,  Psych: Appropriate affect, pleasant  Neuro:  MS: AA&Ox3, appropriately interactive, normal affect   Speech: fluent w/o paraphasic error   Memory: good recent and remote recall   CN:  PERRL, EOMI no nystagmus, no ptosis, sensation intact to LT V1-V3 bilat, face symmetric, no weakness, hearing grossly intact, tongue protrudes midline, no fasiculations noted.  Motor: normal bulk and tone  Severely limited active ROM in R shoulder, able to passively move almost full ROM Reflexes: symmetrical, bilat downgoing toes  Sens: LT intact in all extremities  Brief Motor UPDRS  Speech: mild tremor in speech  Facial Expression: mild masked facies, decreased bilat blink  Tremor:  Rest: noted pill rolling rest tremor  R: 3  L: 2  Action/postural  R: unable to test  L: 1...noted re-emergent tremor  Rigidity:  Mild cogwheeling bilat UE  Finger taps:  R:2  L:2  Open/close hands:  R:2  L:1  Foot taps:  R:3  L:3  Arising from Chair:  3  Gait/FOG:  Requires assistance to stand, only able to take 1-2 steps with assistance     Assessment:  After physical and neurologic examination, review of laboratory studies, imaging, neurophysiology testing and pre-existing records, assessment will be reviewed on the problem list.  Plan:  Treatment plan and additional workup will be reviewed under Problem List.  1)Parkinsonism 2)Rotator cuff tear  Ms. Napier is a pleasant 79y/o woman with chronic history of tremor, consistent with a diagnosis of parkinsonism. She has tried a course  of Sinemet, patient feels she did not tolerate or benefit from the medication and wishes to come off at this time. Counseled patient on the benefits and risks of coming off this medication, she and her daughter expressed understanding and the patient wishes to come off. Explained to her that she needs to come out on a slow taper to prevent withdrawal symptoms.  Offered patient the option of adding Lodosyn to prevent nausea, or trying smaller more frequent doses of Sinemet but patient declined and instead wishes to discontinue Sinemet. Also offered the option of a Neupro patch but again patient declined. Discussed the possibility of a DAT scan to confirm diagnosis of parkinsonism, the patient again wishes to hold off at this time. Follow up as needed.  Over 50 minutes was spent with patient. Over half that time was spent in direct face to face consultation. We extensively discussed the benefits and risks of coming off of Sinemet. We discussed the proper way to titrate off it. All questions were fully answered.

## 2012-12-02 ENCOUNTER — Telehealth: Payer: Self-pay

## 2012-12-02 MED ORDER — CARVEDILOL 3.125 MG PO TABS: 3.1250 mg | ORAL_TABLET | Freq: Two times a day (BID) | ORAL | Status: DC

## 2012-12-02 NOTE — Telephone Encounter (Signed)
Received fax refill request  Rx # [id number]  Medication:  Carvedilol 3.125mg  tab Qty 60 Sig:  1 tab by mouth twice daily with meals Physician:  Wall (transferred per wall note to [facility])

## 2012-12-02 NOTE — Telephone Encounter (Signed)
Medication sent via escribe.  

## 2012-12-17 ENCOUNTER — Telehealth: Payer: Self-pay | Admitting: Cardiology

## 2012-12-17 MED ORDER — SPIRONOLACTONE 25 MG PO TABS: 25.0000 mg | ORAL_TABLET | Freq: Every day | ORAL | Status: DC

## 2012-12-17 NOTE — Telephone Encounter (Signed)
Received fax refill request  Rx # [id number] Medication:  Spironolactone 25mg  Qty 30 Sig:  Take one tablet daily Physician:  Daleen Squibb

## 2012-12-24 ENCOUNTER — Other Ambulatory Visit: Payer: Self-pay

## 2012-12-24 MED ORDER — AMIODARONE HCL 200 MG PO TABS: 200.0000 mg | ORAL_TABLET | Freq: Two times a day (BID) | ORAL | Status: DC

## 2013-01-08 ENCOUNTER — Telehealth: Payer: Self-pay | Admitting: Internal Medicine

## 2013-01-08 NOTE — Telephone Encounter (Signed)
Patient would like to know how close she can get to magnets. / tgs

## 2013-01-08 NOTE — Telephone Encounter (Signed)
Please advise 

## 2013-01-13 ENCOUNTER — Encounter: Payer: Self-pay | Admitting: Family Medicine

## 2013-01-13 ENCOUNTER — Ambulatory Visit (INDEPENDENT_AMBULATORY_CARE_PROVIDER_SITE_OTHER): Payer: Medicare Other | Admitting: Family Medicine

## 2013-01-13 VITALS — BP 92/52 | Ht 59.0 in | Wt 111.0 lb

## 2013-01-13 DIAGNOSIS — Z23 Encounter for immunization: Secondary | ICD-10-CM

## 2013-01-13 DIAGNOSIS — E785 Hyperlipidemia, unspecified: Secondary | ICD-10-CM

## 2013-01-13 DIAGNOSIS — G252 Other specified forms of tremor: Secondary | ICD-10-CM

## 2013-01-13 DIAGNOSIS — M1990 Unspecified osteoarthritis, unspecified site: Secondary | ICD-10-CM

## 2013-01-13 DIAGNOSIS — K4090 Unilateral inguinal hernia, without obstruction or gangrene, not specified as recurrent: Secondary | ICD-10-CM

## 2013-01-13 DIAGNOSIS — R42 Dizziness and giddiness: Secondary | ICD-10-CM

## 2013-01-13 DIAGNOSIS — I10 Essential (primary) hypertension: Secondary | ICD-10-CM

## 2013-01-13 DIAGNOSIS — M25519 Pain in unspecified shoulder: Secondary | ICD-10-CM

## 2013-01-13 DIAGNOSIS — M25511 Pain in right shoulder: Secondary | ICD-10-CM

## 2013-01-13 DIAGNOSIS — G250 Essential tremor: Secondary | ICD-10-CM

## 2013-01-13 DIAGNOSIS — I442 Atrioventricular block, complete: Secondary | ICD-10-CM

## 2013-01-13 NOTE — Progress Notes (Signed)
   Subjective:    Patient ID: [name], female    DOB: 1924/02/04, 79 y.o.   MRN: [id number]  HPI Patient is here today for a 3 month check up.   She states she still has shoulder and hip pain. In the midst of workup and management via the orthopedic doctors. See Dr. Mort Sawyers note.  Pt also noticed a hernia in her groin 2 weeks ago.left side, worse when walking. No change in bowel habits no fever  Pt would like the flu vaccine.  Dr Romeo Apple saw pt, felt pt had torn rot cuff, Therapy did not help.  Recently saw a cardiologist. They maintain same medications.  Patient reports ongoing stress feels anxious at times.   Still experiencing considerable tremor. Has seen the specialist for this and feels the Sinemet is not helping much   Review of Systems    no headache no chest pain diminished energy no shortness of breath except with significant exertion no abdominal pain no change in bowel habits ROS otherwise negative Objective:   Physical Exam  Alert HEENT normal. Lungs clear. Heart regular rate and rhythm. Abdomen benign. Left groin mass palpated in the inguinal region. Ankles without edema pulses good sensation feet good. Significant cerumen impaction noted.      Assessment & Plan:  Impression 1 hypertension good control discussed #2 neuropathy question other etiologies neurological followed by specialist however discuss. #3 left inguinal hernia new onset discussed importance consult #4 chronic anxiety ongoing and considerable #5. Ongoing joint pain #6 cerumen impaction. plan a least 35 minutes with patient most in discussion. Diet exercise discussed. See nurse next week for cerumen removal. Recheck in 4 months. Consult with general surgeon.

## 2013-01-19 NOTE — Addendum Note (Signed)
Addended by: Donna Bernard on: 01/19/2013 07:01 PM   Modules accepted: Orders

## 2013-01-22 ENCOUNTER — Encounter (INDEPENDENT_AMBULATORY_CARE_PROVIDER_SITE_OTHER): Payer: Medicare Other | Admitting: *Deleted

## 2013-01-22 ENCOUNTER — Other Ambulatory Visit: Payer: Self-pay | Admitting: *Deleted

## 2013-01-22 DIAGNOSIS — H6123 Impacted cerumen, bilateral: Secondary | ICD-10-CM

## 2013-01-22 NOTE — Progress Notes (Signed)
Irrigated both ears. Pt tolerated well.

## 2013-01-23 ENCOUNTER — Ambulatory Visit (INDEPENDENT_AMBULATORY_CARE_PROVIDER_SITE_OTHER): Payer: Medicare Other | Admitting: *Deleted

## 2013-01-23 DIAGNOSIS — Z95810 Presence of automatic (implantable) cardiac defibrillator: Secondary | ICD-10-CM

## 2013-01-23 DIAGNOSIS — I428 Other cardiomyopathies: Secondary | ICD-10-CM

## 2013-01-29 ENCOUNTER — Ambulatory Visit (INDEPENDENT_AMBULATORY_CARE_PROVIDER_SITE_OTHER): Payer: Self-pay | Admitting: General Surgery

## 2013-01-31 ENCOUNTER — Encounter: Payer: Self-pay | Admitting: Internal Medicine

## 2013-01-31 LAB — MDC_IDC_ENUM_SESS_TYPE_REMOTE
Brady Statistic RA Percent Paced: 82 %
Brady Statistic RV Percent Paced: 99 %
Implantable Pulse Generator Serial Number: 583543
Lead Channel Impedance Value: 571 Ohm
Lead Channel Setting Pacing Amplitude: 2.9 V
Lead Channel Setting Pacing Pulse Width: 0.4 ms
Lead Channel Setting Pacing Pulse Width: 1.2 ms
Zone Setting Detection Interval: 285.7 ms
Zone Setting Detection Interval: 375 ms

## 2013-02-10 ENCOUNTER — Encounter: Payer: Self-pay | Admitting: *Deleted

## 2013-02-21 ENCOUNTER — Encounter (INDEPENDENT_AMBULATORY_CARE_PROVIDER_SITE_OTHER): Payer: Self-pay | Admitting: General Surgery

## 2013-02-21 ENCOUNTER — Ambulatory Visit (INDEPENDENT_AMBULATORY_CARE_PROVIDER_SITE_OTHER): Payer: Medicare Other | Admitting: General Surgery

## 2013-02-21 VITALS — BP 110/61 | HR 64 | Temp 98.1°F | Resp 14 | Ht 59.0 in | Wt 113.4 lb

## 2013-02-21 DIAGNOSIS — K4090 Unilateral inguinal hernia, without obstruction or gangrene, not specified as recurrent: Secondary | ICD-10-CM

## 2013-02-21 NOTE — Progress Notes (Signed)
Subjective:     Patient ID: [name], female   DOB: [birth year], 80 y.o.   MRN: [id number]  HPI The patient is an 80 year old female referred by Dr. Gerda Diss for an evaluation of a left inguinal hernia. This states been there for approximately 5-6 months. It's not causing her any pain. She is able to reduce it when she lays down.  The patient also has history of degenerative joint disease as well as a rotator cuff injury. Patient is currently not been operated on secondary to her congestive heart failure. Patient also has a Facilities manager.  Review of Systems  Constitutional: Negative.   HENT: Negative.   Respiratory: Negative.   Cardiovascular: Negative.   Gastrointestinal: Negative.   Neurological: Negative.   All other systems reviewed and are negative.       Objective:   Physical Exam  Constitutional: She is oriented to person, place, and time. She appears well-developed and well-nourished.  HENT:  Head: Normocephalic and atraumatic.  Eyes: Conjunctivae and EOM are normal. Pupils are equal, round, and reactive to light.  Neck: Normal range of motion. Neck supple.  Cardiovascular: Normal rate, regular rhythm and normal heart sounds.   Pulmonary/Chest: Effort normal and breath sounds normal.  Abdominal: Soft. Bowel sounds are normal. She exhibits no distension. There is no tenderness. There is no rebound and no guarding. A hernia is present. Hernia confirmed positive in the left inguinal area (reducible).  Musculoskeletal: Normal range of motion.  Neurological: She is alert and oriented to person, place, and time.  Skin: Skin is warm and dry.  Psychiatric: She has a normal mood and affect.       Assessment:     A 80 year old female with a reducible left inguinal hernia.     Plan:     1. At this point I would not recommend watchful waiting of her hernia. Discussed with the signs and symptoms of incarceration or strangulation which time she would need to  report to the ER. Should this occur she would most likely need to undergo either spinal or local anesthesia secondary to her condition. 2. I will give her a prescription for a Truss which he can use to help support the hernia.  3. Patient to follow up as needed

## 2013-03-03 ENCOUNTER — Telehealth: Payer: Self-pay | Admitting: *Deleted

## 2013-03-03 NOTE — Telephone Encounter (Signed)
[name] from [facility] pacer clinic will call daughter and advise her how to transmit wireless device. I also told Baxter Hire that daughter soul take her mother to closest hospital via 911 for emergency and let the hospital give her directions on cardiology follow up if needed

## 2013-03-03 NOTE — Telephone Encounter (Signed)
Pt daughter is calling to see if she can get a wireless device to have at her house. States that pt has been staying with her more often and [place]. She would like to have one for when her mom comes to stay.  She also needs to know what to ask the local hospitals to find out what hospital she should take her to incase of emergency. The town she lives on only has 1 cardiologist but she is a hour away from the major hospitals.

## 2013-03-17 ENCOUNTER — Telehealth: Payer: Self-pay | Admitting: Family Medicine

## 2013-03-17 ENCOUNTER — Telehealth: Payer: Self-pay

## 2013-03-17 MED ORDER — KETOPROFEN 10 % CREA: TOPICAL_CREAM | Status: DC

## 2013-03-17 MED ORDER — BENAZEPRIL HCL 10 MG PO TABS: 10.0000 mg | ORAL_TABLET | Freq: Every day | ORAL | Status: DC

## 2013-03-17 NOTE — Telephone Encounter (Addendum)
Is staying with her daughter in [place] for an undetermined amount of time and needs refill on Benazepril 10 mg could you call in to [facility] in [place] [phone].  Also could you call in Ketaprofen Cream 30 g tube for minor aches and pains for hip to [facility] in [place] [phone]

## 2013-03-17 NOTE — Addendum Note (Signed)
Addended byOneal Deputy D on: 03/17/2013 01:26 PM   Modules accepted: Orders

## 2013-03-17 NOTE — Telephone Encounter (Signed)
Patient is staying with Ms. Perkins in [place].  She has a question about the device that reads the defib.  Please let her know if device check needs to be brought back.

## 2013-03-17 NOTE — Telephone Encounter (Signed)
Med sent and patient notified 

## 2013-03-17 NOTE — Telephone Encounter (Signed)
Spoke w/daughter in regards to follow up. Sent application to [employer] for cell adapter. Daughter aware of this and will pick up communicator this weekend. Daughter also aware that pt will need to be seen in September and the schedule for ICD follow up was gone over. Daughter aware.

## 2013-03-17 NOTE — Telephone Encounter (Signed)
Pt's daughter Rosalee Kaufman)  might be moving her mother to [place] where she lives. She has some questions about the device and rather talk to device clinic. Could you please call the daughter? [phone] Thanks.

## 2013-03-17 NOTE — Addendum Note (Signed)
Addended byOneal Deputy D on: 03/17/2013 12:02 PM   Modules accepted: Orders

## 2013-03-17 NOTE — Telephone Encounter (Signed)
Ok plus 5 ref 

## 2013-03-17 NOTE — Telephone Encounter (Signed)
Apply thin layer up to three times per day to affected area

## 2013-03-21 ENCOUNTER — Telehealth: Payer: Self-pay | Admitting: *Deleted

## 2013-03-21 ENCOUNTER — Ambulatory Visit (INDEPENDENT_AMBULATORY_CARE_PROVIDER_SITE_OTHER): Payer: Medicare Other | Admitting: Adult Health

## 2013-03-21 ENCOUNTER — Encounter: Payer: Self-pay | Admitting: Adult Health

## 2013-03-21 VITALS — BP 113/78 | HR 53 | Ht 59.0 in | Wt 110.0 lb

## 2013-03-21 DIAGNOSIS — I472 Ventricular tachycardia, unspecified: Secondary | ICD-10-CM

## 2013-03-21 DIAGNOSIS — I4729 Other ventricular tachycardia: Secondary | ICD-10-CM

## 2013-03-21 DIAGNOSIS — I10 Essential (primary) hypertension: Secondary | ICD-10-CM

## 2013-03-21 DIAGNOSIS — I428 Other cardiomyopathies: Secondary | ICD-10-CM

## 2013-03-21 LAB — CBC
HEMATOCRIT: 42.3 % (ref 36.0–46.0)
HEMOGLOBIN: 14.2 g/dL (ref 12.0–15.0)
MCH: 30.6 pg (ref 26.0–34.0)
MCHC: 33.6 g/dL (ref 30.0–36.0)
MCV: 91.2 fL (ref 78.0–100.0)
Platelets: 314 10*3/uL (ref 150–400)
RBC: 4.64 MIL/uL (ref 3.87–5.11)
RDW: 14.1 % (ref 11.5–15.5)
WBC: 7.4 10*3/uL (ref 4.0–10.5)

## 2013-03-21 LAB — BASIC METABOLIC PANEL
BUN: 37 mg/dL — ABNORMAL HIGH (ref 6–23)
CALCIUM: 10 mg/dL (ref 8.4–10.5)
CO2: 27 meq/L (ref 19–32)
CREATININE: 1.62 mg/dL — AB (ref 0.50–1.10)
Chloride: 99 mEq/L (ref 96–112)
GLUCOSE: 63 mg/dL — AB (ref 70–99)
Potassium: 5.3 mEq/L (ref 3.5–5.3)
Sodium: 144 mEq/L (ref 135–145)

## 2013-03-21 LAB — TSH: TSH: 3.883 u[IU]/mL (ref 0.350–4.500)

## 2013-03-21 MED ORDER — CARVEDILOL 3.125 MG PO TABS: 3.1250 mg | ORAL_TABLET | Freq: Two times a day (BID) | ORAL | Status: DC

## 2013-03-21 MED ORDER — BENAZEPRIL HCL 10 MG PO TABS: 10.0000 mg | ORAL_TABLET | Freq: Every day | ORAL | Status: DC

## 2013-03-21 MED ORDER — AMIODARONE HCL 200 MG PO TABS: 200.0000 mg | ORAL_TABLET | Freq: Two times a day (BID) | ORAL | Status: DC

## 2013-03-21 MED ORDER — SPIRONOLACTONE 25 MG PO TABS: 25.0000 mg | ORAL_TABLET | Freq: Every day | ORAL | Status: DC

## 2013-03-21 NOTE — Assessment & Plan Note (Signed)
She remains stable from CV standpoint. She offers no cardiac complaints. Will continue current medication regimen to include ACE, BB, and spironolactone. CMET will be drawn.  Records are to be printed  for her to take to [facility] to assist with establishment with cardiologist there.

## 2013-03-21 NOTE — Assessment & Plan Note (Signed)
She remains on amiodarone. No issues with breathing status or coughing. I will check a TSH with labs today.

## 2013-03-21 NOTE — Telephone Encounter (Signed)
Pt came into our office with appointment. Her daughter said that her pacemaker transmitter has not been received in the mail yet. Please advise.

## 2013-03-21 NOTE — Progress Notes (Deleted)
Name: [name]    DOB: 11-26-1923  Age: 80 y.o.  MR#: [id number]       PCP:  Harlow Asa, MD      Insurance: Payor: [employer] [place] [employer] / Plan: BLUE MEDICARE / Product Type: *No Product type* /   CC:   No chief complaint on file.   VS Filed Vitals:   03/21/13 1052  BP: 113/78  Pulse: 53  Height: 4\' 11"  (1.499 m)  Weight: 110 lb (49.896 kg)    Weights Current Weight  03/21/13 110 lb (49.896 kg)  02/21/13 113 lb 6.4 oz (51.438 kg)  01/13/13 111 lb (50.349 kg)    Blood Pressure  BP Readings from Last 3 Encounters:  03/21/13 113/78  02/21/13 110/61  01/13/13 92/52     Admit date:  (Not on file) Last encounter with RMR:  Visit date not found   Allergy Lipitor; Pravachol; and Zetia  Current Outpatient Prescriptions  Medication Sig Dispense Refill  . amiodarone (PACERONE) 200 MG tablet Take 1 tablet (200 mg total) by mouth 2 (two) times daily.  60 tablet  6  . aspirin 325 MG EC tablet Take 325 mg by mouth daily.      . benazepril (LOTENSIN) 10 MG tablet Take 1 tablet (10 mg total) by mouth daily.  30 tablet  5  . Calcium Carbonate-Vitamin D (CALCIUM 600 + D PO) Take 1 tablet by mouth daily.      . carvedilol (COREG) 3.125 MG tablet Take 1 tablet (3.125 mg total) by mouth 2 (two) times daily with a meal.  60 tablet  3  . Cholecalciferol (VITAMIN D-3) 1000 UNITS CAPS Take 1,000 Units by mouth 2 (two) times daily.       Marland Kitchen HYDROcodone-acetaminophen (NORCO/VICODIN) 5-325 MG per tablet Take 1 tablet by mouth every 4 (four) hours as needed.       . Ketoprofen 10 % CREA Apply thin layer up to three times per day to affected area  30 g  0  . Multiple Vitamin (MULITIVITAMIN WITH MINERALS) TABS Take 1 tablet by mouth every morning.      . naproxen (NAPROSYN) 500 MG tablet Take 1 tablet by mouth 2 (two) times daily.      . NEOMYCIN-POLYMYXIN-HYDROCORTISONE (CORTISPORIN) 1 % SOLN otic solution       . Omega-3 Fatty Acids (FISH OIL PO) Take 1 g by mouth daily.        . ondansetron (ZOFRAN) 8 MG tablet Take 1 tablet (8 mg total) by mouth every 6 (six) hours as needed for nausea.  25 tablet  2  . polyethylene glycol (MIRALAX / GLYCOLAX) packet Take 17 g by mouth daily as needed (Constipation).      Marland Kitchen spironolactone (ALDACTONE) 25 MG tablet Take 1 tablet (25 mg total) by mouth daily.  30 tablet  3   No current facility-administered medications for this visit.    Discontinued Meds:    Medications Discontinued During This Encounter  Medication Reason  . carbidopa-levodopa (SINEMET IR) 25-100 MG per tablet Error    Patient Active Problem List   Diagnosis Date Noted  . Right shoulder pain 11/19/2012  . Rotator cuff tear 11/19/2012  . Rotator cuff injury 11/19/2012  . Atrial fibrillation 09/27/2012  . Chronic systolic heart failure 02/28/2012  . Essential tremor 01/31/2012  . Ventricular tachycardia 01/31/2012  . Chronic left hip pain 01/20/2012  . Degenerative joint disease 01/16/2012  . UTI (urinary tract infection) 01/15/2012  .  Dizziness 01/15/2012  . Chronic sinusitis 01/15/2012  . HTN (hypertension) 01/15/2012  . Hyperlipidemia 11/23/2010  . ATRIOVENTRICULAR BLOCK, COMPLETE 09/23/2008  . Nonischemic cardiomyopathy 09/23/2008  . Automatic implantable cardiac defibrillator in situ 09/23/2008    LABS    Component Value Date/Time   NA 138 01/31/2012 1104   NA 140 01/18/2012 0423   NA 141 01/16/2012 0529   K 4.5 01/31/2012 1104   K 4.0 01/18/2012 0423   K 4.4 01/16/2012 0529   CL 99 01/31/2012 1104   CL 101 01/18/2012 0423   CL 106 01/16/2012 0529   CO2 29 01/31/2012 1104   CO2 31 01/18/2012 0423   CO2 28 01/16/2012 0529   GLUCOSE 63* 01/31/2012 1104   GLUCOSE 87 01/18/2012 0423   GLUCOSE 75 01/16/2012 0529   BUN 19 01/31/2012 1104   BUN 14 01/18/2012 0423   BUN 21 01/16/2012 0529   CREATININE 0.90 01/31/2012 1104   CREATININE 0.71 01/18/2012 0423   CREATININE 0.68 01/16/2012 0529   CREATININE 0.69 01/15/2012 1342   CREATININE 0.81  08/21/2011 0000   CREATININE 0.96 08/12/2010 0822   CALCIUM 9.9 01/31/2012 1104   CALCIUM 9.0 01/18/2012 0423   CALCIUM 9.0 01/16/2012 0529   GFRNONAA 76* 01/18/2012 0423   GFRNONAA 77* 01/16/2012 0529   GFRNONAA 77* 01/15/2012 1342   GFRAA 88* 01/18/2012 0423   GFRAA 89* 01/16/2012 0529   GFRAA 89* 01/15/2012 1342   CMP     Component Value Date/Time   NA 138 01/31/2012 1104   K 4.5 01/31/2012 1104   CL 99 01/31/2012 1104   CO2 29 01/31/2012 1104   GLUCOSE 63* 01/31/2012 1104   BUN 19 01/31/2012 1104   CREATININE 0.90 01/31/2012 1104   CREATININE 0.71 01/18/2012 0423   CALCIUM 9.9 01/31/2012 1104   PROT 6.9 05/21/2012 1203   ALBUMIN 4.0 05/21/2012 1203   AST 20 05/21/2012 1203   ALT 18 05/21/2012 1203   ALKPHOS 62 05/21/2012 1203   BILITOT 0.3 05/21/2012 1203   GFRNONAA 76* 01/18/2012 0423   GFRAA 88* 01/18/2012 0423       Component Value Date/Time   WBC 8.3 01/16/2012 0529   WBC 7.1 01/15/2012 1342   WBC 6.7 08/12/2010 0822   HGB 12.1 01/16/2012 0529   HGB 13.3 01/15/2012 1342   HGB 12.4 06/08/2011 0930   HCT 36.6 01/16/2012 0529   HCT 40.1 01/15/2012 1342   HCT 38.5 06/08/2011 0930   MCV 93.1 01/16/2012 0529   MCV 93.3 01/15/2012 1342   MCV 91.2 08/12/2010 0822    Lipid Panel     Component Value Date/Time   CHOL 230* 05/21/2012 1203   TRIG 129 05/21/2012 1203   HDL 64 05/21/2012 1203   CHOLHDL 3.6 05/21/2012 1203   VLDL 26 05/21/2012 1203   LDLCALC 140* 05/21/2012 1203    ABG    Component Value Date/Time   PHART 7.315* 12/02/2009 1317   PCO2ART 58.1* 12/02/2009 1317   PO2ART 77.0* 12/02/2009 1317   HCO3 29.6* 12/02/2009 1317   TCO2 29 12/16/2009 2345   O2SAT 94.0 12/02/2009 1317     Lab Results  Component Value Date   TSH 1.504 01/15/2012   BNP (last 3 results) No results found for this basename: PROBNP,  in the last 8760 hours Cardiac Panel (last 3 results) No results found for this basename: CKTOTAL, CKMB, TROPONINI, RELINDX,  in the last 72 hours  Iron/TIBC/Ferritin No results found  for this basename: iron, tibc, ferritin  EKG Orders placed in visit on 10/29/12  . EKG 12-LEAD     Prior Assessment and Plan Problem List as of 03/21/2013     Cardiovascular and Mediastinum   ATRIOVENTRICULAR BLOCK, COMPLETE   Nonischemic cardiomyopathy   Last Assessment & Plan   01/31/2012 Office Visit Edited 01/31/2012 11:03 AM by Dyann Kief, PA     Patient well compensated without evidence of heart failure. Will f/u BMET.    HTN (hypertension)   Last Assessment & Plan   10/25/2012 Office Visit Written 10/25/2012 11:38 AM by Marinus Maw, MD     Her blood pressure is well controlled. No change in medical therapy.    Ventricular tachycardia   Last Assessment & Plan   10/25/2012 Office Visit Written 10/25/2012 11:37 AM by Marinus Maw, MD     The patient has had no recurrent ventricular arrhythmias on amiodarone 400 mg daily. While I considered reducing her dose of amiodarone today, I think he would be most appropriate to do this when I see her back in 6 months, if she remains arrhythmia free.    Chronic systolic heart failure   Last Assessment & Plan   09/27/2012 Office Visit Written 09/27/2012 11:34 AM by Gaylord Shih, MD     Stable when she is careful l with her diet. Reinforced salt restriction. Also advised to call the office if she begins to get progressive edema that does not go down at night. She is not on Lasix. We'll hold this for now. She'll continue with spironolactone.    Atrial fibrillation   Last Assessment & Plan   09/27/2012 Office Visit Written 09/27/2012 11:32 AM by Gaylord Shih, MD     This appears to be new and is asymptomatic. She is artery on amiodarone for history of ventricular tachycardia and is totally paced today. She is high fall risk with her Parkinson's disease and general frail status. I would not recommend Coumadin or anticoagulation. I've advised her to take aspirin 325 mg a day. We'll have scheduled the return to the device clinic.       Respiratory   Chronic sinusitis     Nervous and Auditory   Essential tremor   Last Assessment & Plan   01/31/2012 Office Visit Written 01/31/2012 11:00 AM by Dyann Kief, PA     Patient is on primidone for essential tremor prescribed by Dr. Christella Hartigan but she ran out of it Sunday. She is extremely shaky and does not feel well in general. I asked her to contact him immediately to obtain a refill to avoid any further withdrawal symptoms. Our office is helping facilitate that.      Musculoskeletal and Integument   Degenerative joint disease   Rotator cuff tear   Rotator cuff injury     Genitourinary   UTI (urinary tract infection)     Other   Automatic implantable cardiac defibrillator in situ   Last Assessment & Plan   10/25/2012 Office Visit Written 10/25/2012 11:38 AM by Marinus Maw, MD     The patient's [facility] device is working normally. We'll plan to recheck in several months.    Hyperlipidemia   Last Assessment & Plan   08/21/2011 Office Visit Written 08/21/2011 10:05 AM by Gaylord Shih, MD     We'll check lipids today and LFTs since she's here.    Dizziness   Chronic left hip pain   Right shoulder pain       Imaging:  No results found.

## 2013-03-21 NOTE — Assessment & Plan Note (Signed)
BP is low normal in the setting of systolic dysfunction. She is to continue current mediations. Happy to see her again should she be in the area. Labs will be sent to PCP. Records will be made available to new physician on request. Some records are printed for her concerning her PPM implantation as well as prior EP notes,.

## 2013-03-21 NOTE — Patient Instructions (Addendum)
Your physician recommends that you schedule a follow-up appointment  Prn  Please get the following labs done at [facility] lab: CBC,TSH ,BMET  We will refill your medications  Your physician recommends that you continue on your current medications as directed. Please refer to the Current Medication list given to you today.     Thanks for choosing [facility] !

## 2013-03-21 NOTE — Progress Notes (Signed)
HPI: [name] is a 80 y/o patient of Dr. Lewayne BuntingGregg Taylor we are following for ongoing assessment and management of NICM, chronic systolic CHF EF of 25%, CHB, s/p ICD pacemaker 2013, [facility]), hypokalemia, with history of multiple medical problems. She comes today for cardiology follow up. Her daughter is with her, and states that she will be taking her mother to live with her in [place], [place]. She will be established with a cardiologist there, Dr.White.     The patient is without cardiac complaint today. She has had no rapid HR, no LEE, no DOE or chest pain. She is frail, uses a walker for ambulation. No dizziness. She is medically compliant. She has Parkinson;s Disease and continues to have tremors,.  Allergies  Allergen Reactions  . Lipitor [Atorvastatin]     Leg pain  . Pravachol [Pravastatin Sodium]     Headache  . Zetia [Ezetimibe]     Afraid    Current Outpatient Prescriptions  Medication Sig Dispense Refill  . amiodarone (PACERONE) 200 MG tablet Take 1 tablet (200 mg total) by mouth 2 (two) times daily.  60 tablet  6  . aspirin 325 MG EC tablet Take 325 mg by mouth daily.      . benazepril (LOTENSIN) 10 MG tablet Take 1 tablet (10 mg total) by mouth daily.  30 tablet  5  . Calcium Carbonate-Vitamin D (CALCIUM 600 + D PO) Take 1 tablet by mouth daily.      . carvedilol (COREG) 3.125 MG tablet Take 1 tablet (3.125 mg total) by mouth 2 (two) times daily with a meal.  60 tablet  3  . Cholecalciferol (VITAMIN D-3) 1000 UNITS CAPS Take 1,000 Units by mouth 2 (two) times daily.       Marland Kitchen. HYDROcodone-acetaminophen (NORCO/VICODIN) 5-325 MG per tablet Take 1 tablet by mouth every 4 (four) hours as needed.       . Ketoprofen 10 % CREA Apply thin layer up to three times per day to affected area  30 g  0  . Multiple Vitamin (MULITIVITAMIN WITH MINERALS) TABS Take 1 tablet by mouth every morning.      . naproxen (NAPROSYN) 500 MG tablet Take 1 tablet by mouth 2 (two) times  daily.      . NEOMYCIN-POLYMYXIN-HYDROCORTISONE (CORTISPORIN) 1 % SOLN otic solution       . Omega-3 Fatty Acids (FISH OIL PO) Take 1 g by mouth daily.       . ondansetron (ZOFRAN) 8 MG tablet Take 1 tablet (8 mg total) by mouth every 6 (six) hours as needed for nausea.  25 tablet  2  . polyethylene glycol (MIRALAX / GLYCOLAX) packet Take 17 g by mouth daily as needed (Constipation).      Marland Kitchen. spironolactone (ALDACTONE) 25 MG tablet Take 1 tablet (25 mg total) by mouth daily.  90 tablet  3   No current facility-administered medications for this visit.    Past Medical History  Diagnosis Date  . Glaucoma   . CHF (congestive heart failure)   . HTN (hypertension)   . Tremor     sees Dr. Gerilyn Pilgrimoonquah, neurologist  . Arthritis     left hip-degeneration  . Hip pain, left   . Paroxysmal ventricular tachycardia 09/23/2008    Centricity Description: VENTRICULAR TACHYCARDIA Qualifier: Diagnosis of  By: Via LPN, Larita FifeLynn   Centricity Description: PAROXYSMAL VENTRICULAR TACHYCARDIA Qualifier: Diagnosis of  By: Via LPN, Larita FifeLynn    . Nonischemic cardiomyopathy 09/23/2008  Cardiac catheterization in 2011: EF of 25%; normal coronary angiography.    . Automatic implantable cardiac defibrillator in situ 09/23/2008    Appropriate discharges in 2011 and 01/2012   . Dizziness 01/15/2012  . UTI (urinary tract infection) 01/15/2012  . Anxiety   . Hyperlipidemia   . Osteoporosis   . Pacemaker   . Cardiac defibrillator in place   . Shoulder joint pain     difficulty raising arm-right  . High cholesterol   . Heart disease   . A-fib     Past Surgical History  Procedure Laterality Date  . Icd implantation  2003    [employer]; remote - no  . Ankle fracture surgery  1992    left  . Bladder suspension  1994  . Abdominal hysterectomy  1973    partial  . Tonsillectomy  ages 46 and 81    [place]  . Cataract extraction w/phaco  06/15/2011    Procedure: CATARACT EXTRACTION PHACO AND INTRAOCULAR LENS PLACEMENT  (IOC);  Surgeon: Gemma Payor, MD;  Location: [facility];  Service: Ophthalmology;  Laterality: Right;  CDE:20.26  . Cataract extraction w/phaco  09/14/2011    Procedure: CATARACT EXTRACTION PHACO AND INTRAOCULAR LENS PLACEMENT (IOC);  Surgeon: Gemma Payor, MD;  Location: [facility];  Service: Ophthalmology;  Laterality: Left;  CDE 31.96  . Heart  surgery  2003    gadgets implant  . Ankle surgery  1992    broken     ROS: Review of systems complete and found to be negative unless listed above  PHYSICAL EXAM BP 113/78  Pulse 53  Ht 4\' 11"  (1.499 m)  Wt 110 lb (49.896 kg)  BMI 22.21 kg/m2  General: Well developed, well nourished, in no acute distress, frail Head: Eyes PERRLA, No xanthomas.   Normal cephalic and atramatic  Lungs: Clear bilaterally to auscultation and percussion. Heart: HRRR S1 S2, without MRG.  Pulses are 2+ & equal.            No carotid bruit. No JVD.  No abdominal bruits. No femoral bruits. Abdomen: Bowel sounds are positive, abdomen soft and non-tender without masses or                  Hernia's noted. Msk:  Back normal, normal gait. Normal strength and tone for age. Extremities: No clubbing, cyanosis or edema.  DP +1 Neuro: Alert and oriented X 3. Hard of hearing. Psych:  Good affect, responds appropriately    ASSESSMENT AND PLAN

## 2013-03-24 ENCOUNTER — Telehealth: Payer: Self-pay | Admitting: Family Medicine

## 2013-03-24 NOTE — Telephone Encounter (Signed)
Medication was called in to the pharmacy. Daughter was notified.

## 2013-03-24 NOTE — Telephone Encounter (Signed)
Patient says that [facility]. In [place], [place] did not receive Rx for Ketoprofen 10 % CREA, and would like this recalled in. Please call Dois Davenport when done.

## 2013-03-26 ENCOUNTER — Encounter: Payer: Self-pay | Admitting: *Deleted

## 2013-03-31 ENCOUNTER — Encounter: Payer: Self-pay | Admitting: *Deleted

## 2013-04-07 ENCOUNTER — Telehealth: Payer: Self-pay | Admitting: Internal Medicine

## 2013-04-07 NOTE — Telephone Encounter (Signed)
New message     Pt has not received the cellular adaptor for her device box.  Pt does not have a landline phone.

## 2013-04-07 NOTE — Telephone Encounter (Signed)
Per records, cell adapter order was faxed on 03/17/13. I will call local [employer] rep to follow through then return call to [employer].

## 2013-04-14 NOTE — Telephone Encounter (Signed)
Cell adapter received/kwm

## 2013-04-15 ENCOUNTER — Other Ambulatory Visit: Payer: Self-pay | Admitting: *Deleted

## 2013-04-15 MED ORDER — SPIRONOLACTONE 25 MG PO TABS: 25.0000 mg | ORAL_TABLET | Freq: Every day | ORAL | Status: DC

## 2013-04-24 ENCOUNTER — Ambulatory Visit (INDEPENDENT_AMBULATORY_CARE_PROVIDER_SITE_OTHER): Payer: Medicare Other | Admitting: *Deleted

## 2013-04-24 DIAGNOSIS — Z95810 Presence of automatic (implantable) cardiac defibrillator: Secondary | ICD-10-CM

## 2013-04-24 DIAGNOSIS — I428 Other cardiomyopathies: Secondary | ICD-10-CM

## 2013-04-30 ENCOUNTER — Telehealth: Payer: Self-pay | Admitting: Internal Medicine

## 2013-04-30 NOTE — Telephone Encounter (Signed)
Transmission received, patient aware. 

## 2013-04-30 NOTE — Telephone Encounter (Signed)
New message         Pt would like to know if you received her transmission from 3/12

## 2013-05-05 ENCOUNTER — Encounter: Payer: Self-pay | Admitting: Internal Medicine

## 2013-05-05 LAB — MDC_IDC_ENUM_SESS_TYPE_REMOTE
Brady Statistic RA Percent Paced: 85 %
HighPow Impedance: 44 Ohm
Implantable Pulse Generator Serial Number: 583543
Lead Channel Impedance Value: 415 Ohm
Lead Channel Impedance Value: 483 Ohm
Lead Channel Impedance Value: 483 Ohm
Lead Channel Setting Pacing Amplitude: 2.6 V
Lead Channel Setting Pacing Pulse Width: 0.4 ms
MDC IDC MSMT LEADCHNL RA SENSING INTR AMPL: 2.6 mV
MDC IDC SET LEADCHNL LV PACING AMPLITUDE: 4 V
MDC IDC SET LEADCHNL LV PACING PULSEWIDTH: 1.2 ms
MDC IDC SET LEADCHNL RA PACING AMPLITUDE: 2.5 V
MDC IDC STAT BRADY RV PERCENT PACED: 99 %
Zone Setting Detection Interval: 285.7 ms
Zone Setting Detection Interval: 375 ms

## 2013-05-14 ENCOUNTER — Encounter: Payer: Self-pay | Admitting: *Deleted

## 2013-05-28 ENCOUNTER — Other Ambulatory Visit: Payer: Self-pay | Admitting: Family Medicine

## 2013-07-18 ENCOUNTER — Telehealth: Payer: Self-pay | Admitting: Cardiovascular Disease

## 2013-07-18 MED ORDER — AMIODARONE HCL 200 MG PO TABS: 200.0000 mg | ORAL_TABLET | Freq: Two times a day (BID) | ORAL | Status: AC

## 2013-07-18 NOTE — Telephone Encounter (Signed)
rx refill complete

## 2013-07-18 NOTE — Telephone Encounter (Signed)
Needs refill on Amiodarone sent to [facility] in [place] / tgs

## 2013-07-28 ENCOUNTER — Telehealth: Payer: Self-pay

## 2013-07-28 ENCOUNTER — Emergency Department (HOSPITAL_COMMUNITY): Payer: Medicare Other

## 2013-07-28 ENCOUNTER — Inpatient Hospital Stay (HOSPITAL_COMMUNITY)
Admission: EM | Admit: 2013-07-28 | Discharge: 2013-07-30 | DRG: 292 | Disposition: A | Discharge status: Home / Self Care | Payer: Medicare Other | Attending: Internal Medicine | Admitting: Internal Medicine

## 2013-07-28 ENCOUNTER — Encounter (HOSPITAL_COMMUNITY): Payer: Self-pay | Admitting: Emergency Medicine

## 2013-07-28 DIAGNOSIS — J329 Chronic sinusitis, unspecified: Secondary | ICD-10-CM

## 2013-07-28 DIAGNOSIS — I2510 Atherosclerotic heart disease of native coronary artery without angina pectoris: Secondary | ICD-10-CM | POA: Diagnosis present

## 2013-07-28 DIAGNOSIS — I428 Other cardiomyopathies: Secondary | ICD-10-CM | POA: Diagnosis present

## 2013-07-28 DIAGNOSIS — S46009A Unspecified injury of muscle(s) and tendon(s) of the rotator cuff of unspecified shoulder, initial encounter: Secondary | ICD-10-CM

## 2013-07-28 DIAGNOSIS — I472 Ventricular tachycardia, unspecified: Secondary | ICD-10-CM

## 2013-07-28 DIAGNOSIS — G20 Parkinson's disease: Secondary | ICD-10-CM | POA: Diagnosis present

## 2013-07-28 DIAGNOSIS — E7800 Pure hypercholesterolemia, unspecified: Secondary | ICD-10-CM | POA: Diagnosis present

## 2013-07-28 DIAGNOSIS — I442 Atrioventricular block, complete: Secondary | ICD-10-CM

## 2013-07-28 DIAGNOSIS — G250 Essential tremor: Secondary | ICD-10-CM | POA: Diagnosis present

## 2013-07-28 DIAGNOSIS — M75100 Unspecified rotator cuff tear or rupture of unspecified shoulder, not specified as traumatic: Secondary | ICD-10-CM

## 2013-07-28 DIAGNOSIS — G8929 Other chronic pain: Secondary | ICD-10-CM

## 2013-07-28 DIAGNOSIS — M1990 Unspecified osteoarthritis, unspecified site: Secondary | ICD-10-CM

## 2013-07-28 DIAGNOSIS — Z825 Family history of asthma and other chronic lower respiratory diseases: Secondary | ICD-10-CM

## 2013-07-28 DIAGNOSIS — Z95810 Presence of automatic (implantable) cardiac defibrillator: Secondary | ICD-10-CM

## 2013-07-28 DIAGNOSIS — I4729 Other ventricular tachycardia: Secondary | ICD-10-CM | POA: Diagnosis present

## 2013-07-28 DIAGNOSIS — M25552 Pain in left hip: Secondary | ICD-10-CM

## 2013-07-28 DIAGNOSIS — E785 Hyperlipidemia, unspecified: Secondary | ICD-10-CM

## 2013-07-28 DIAGNOSIS — M129 Arthropathy, unspecified: Secondary | ICD-10-CM | POA: Diagnosis present

## 2013-07-28 DIAGNOSIS — I509 Heart failure, unspecified: Secondary | ICD-10-CM

## 2013-07-28 DIAGNOSIS — I5022 Chronic systolic (congestive) heart failure: Secondary | ICD-10-CM

## 2013-07-28 DIAGNOSIS — R079 Chest pain, unspecified: Secondary | ICD-10-CM

## 2013-07-28 DIAGNOSIS — M25511 Pain in right shoulder: Secondary | ICD-10-CM

## 2013-07-28 DIAGNOSIS — I10 Essential (primary) hypertension: Secondary | ICD-10-CM

## 2013-07-28 DIAGNOSIS — G20A1 Parkinson's disease without dyskinesia, without mention of fluctuations: Secondary | ICD-10-CM | POA: Diagnosis present

## 2013-07-28 DIAGNOSIS — R42 Dizziness and giddiness: Secondary | ICD-10-CM

## 2013-07-28 DIAGNOSIS — I4891 Unspecified atrial fibrillation: Secondary | ICD-10-CM | POA: Diagnosis present

## 2013-07-28 DIAGNOSIS — I5023 Acute on chronic systolic (congestive) heart failure: Principal | ICD-10-CM | POA: Diagnosis present

## 2013-07-28 DIAGNOSIS — N390 Urinary tract infection, site not specified: Secondary | ICD-10-CM

## 2013-07-28 DIAGNOSIS — M810 Age-related osteoporosis without current pathological fracture: Secondary | ICD-10-CM | POA: Diagnosis present

## 2013-07-28 DIAGNOSIS — Z8249 Family history of ischemic heart disease and other diseases of the circulatory system: Secondary | ICD-10-CM

## 2013-07-28 DIAGNOSIS — I5021 Acute systolic (congestive) heart failure: Secondary | ICD-10-CM

## 2013-07-28 LAB — BASIC METABOLIC PANEL
BUN: 21 mg/dL (ref 6–23)
CO2: 29 mEq/L (ref 19–32)
Calcium: 8.6 mg/dL (ref 8.4–10.5)
Chloride: 101 mEq/L (ref 96–112)
Creatinine, Ser: 1.02 mg/dL (ref 0.50–1.10)
GFR, EST AFRICAN AMERICAN: 56 mL/min — AB (ref >90)
GFR, EST NON AFRICAN AMERICAN: 48 mL/min — AB (ref >90)
Glucose, Bld: 89 mg/dL (ref 70–99)
Potassium: 4.6 mEq/L (ref 3.7–5.3)
Sodium: 141 mEq/L (ref 137–147)

## 2013-07-28 LAB — URINALYSIS, ROUTINE W REFLEX MICROSCOPIC
Bilirubin Urine: NEGATIVE
Glucose, UA: NEGATIVE mg/dL
HGB URINE DIPSTICK: NEGATIVE
Ketones, ur: NEGATIVE mg/dL
Leukocytes, UA: NEGATIVE
Nitrite: NEGATIVE
Protein, ur: NEGATIVE mg/dL
Specific Gravity, Urine: 1.01 (ref 1.005–1.030)
Urobilinogen, UA: 0.2 mg/dL (ref 0.0–1.0)
pH: 6 (ref 5.0–8.0)

## 2013-07-28 LAB — TROPONIN I
Troponin I: <0.3 ng/mL (ref <0.30)
Troponin I: <0.3 ng/mL (ref <0.30)

## 2013-07-28 LAB — CBC
HCT: 36.5 % (ref 36.0–46.0)
Hemoglobin: 11.8 g/dL — ABNORMAL LOW (ref 12.0–15.0)
MCH: 30.1 pg (ref 26.0–34.0)
MCHC: 32.3 g/dL (ref 30.0–36.0)
MCV: 93.1 fL (ref 78.0–100.0)
PLATELETS: 268 10*3/uL (ref 150–400)
RBC: 3.92 MIL/uL (ref 3.87–5.11)
RDW: 14.3 % (ref 11.5–15.5)
WBC: 7.2 10*3/uL (ref 4.0–10.5)

## 2013-07-28 LAB — PRO B NATRIURETIC PEPTIDE: Pro B Natriuretic peptide (BNP): 2247 pg/mL — ABNORMAL HIGH (ref 0–450)

## 2013-07-28 MED ORDER — ONDANSETRON HCL 4 MG/2ML IJ SOLN: 4.0000 mg | Freq: Four times a day (QID) | INTRAMUSCULAR | Status: DC | PRN

## 2013-07-28 MED ORDER — AMIODARONE HCL 200 MG PO TABS
200.0000 mg | ORAL_TABLET | Freq: Two times a day (BID) | ORAL | Status: DC
  Administered 2013-07-28 – 2013-07-30 (×4): 200 mg via ORAL
  Filled 2013-07-28 (×4): qty 1

## 2013-07-28 MED ORDER — ASPIRIN 81 MG PO CHEW
324.0000 mg | CHEWABLE_TABLET | Freq: Once | ORAL | Status: AC
  Administered 2013-07-28: 324 mg via ORAL
  Filled 2013-07-28: qty 4

## 2013-07-28 MED ORDER — ENOXAPARIN SODIUM 40 MG/0.4ML ~~LOC~~ SOLN
40.0000 mg | SUBCUTANEOUS | Status: DC
  Administered 2013-07-28: 40 mg via SUBCUTANEOUS
  Filled 2013-07-28: qty 0.4

## 2013-07-28 MED ORDER — SODIUM CHLORIDE 0.9 % IJ SOLN: 3.0000 mL | INTRAMUSCULAR | Status: DC | PRN

## 2013-07-28 MED ORDER — CARVEDILOL 3.125 MG PO TABS
3.1250 mg | ORAL_TABLET | Freq: Two times a day (BID) | ORAL | Status: DC
  Administered 2013-07-29 – 2013-07-30 (×3): 3.125 mg via ORAL
  Filled 2013-07-28 (×3): qty 1

## 2013-07-28 MED ORDER — FUROSEMIDE 10 MG/ML IJ SOLN
40.0000 mg | Freq: Every day | INTRAMUSCULAR | Status: DC
  Administered 2013-07-29 – 2013-07-30 (×2): 40 mg via INTRAVENOUS
  Filled 2013-07-28 (×3): qty 4

## 2013-07-28 MED ORDER — FUROSEMIDE 10 MG/ML IJ SOLN
40.0000 mg | Freq: Once | INTRAMUSCULAR | Status: AC
  Administered 2013-07-28: 40 mg via INTRAVENOUS
  Filled 2013-07-28: qty 4

## 2013-07-28 MED ORDER — BENAZEPRIL HCL 10 MG PO TABS
10.0000 mg | ORAL_TABLET | Freq: Every morning | ORAL | Status: DC
  Administered 2013-07-29 – 2013-07-30 (×2): 10 mg via ORAL
  Filled 2013-07-28 (×2): qty 1

## 2013-07-28 MED ORDER — ASPIRIN EC 81 MG PO TBEC
81.0000 mg | DELAYED_RELEASE_TABLET | Freq: Every day | ORAL | Status: DC
  Administered 2013-07-29 – 2013-07-30 (×2): 81 mg via ORAL
  Filled 2013-07-28 (×2): qty 1

## 2013-07-28 MED ORDER — NITROGLYCERIN 0.4 MG SL SUBL: 0.4000 mg | SUBLINGUAL_TABLET | SUBLINGUAL | Status: DC | PRN

## 2013-07-28 MED ORDER — SODIUM CHLORIDE 0.9 % IV SOLN: 250.0000 mL | INTRAVENOUS | Status: DC | PRN

## 2013-07-28 MED ORDER — SODIUM CHLORIDE 0.9 % IJ SOLN
3.0000 mL | Freq: Two times a day (BID) | INTRAMUSCULAR | Status: DC
  Administered 2013-07-28 – 2013-07-30 (×4): 3 mL via INTRAVENOUS

## 2013-07-28 MED ORDER — ACETAMINOPHEN 325 MG PO TABS: 650.0000 mg | ORAL_TABLET | Freq: Four times a day (QID) | ORAL | Status: DC | PRN

## 2013-07-28 NOTE — Telephone Encounter (Signed)
Pt called crying,stating she has had intermittent chest pressure over the last two days.Today she had some jaw pain and stated it hurts when she lies back.I suggested she got to the closest ED for evaluation. Patient agreed with dispo

## 2013-07-28 NOTE — ED Notes (Signed)
Chest pressure in middle of chest x few weeks, c/o of short of breath

## 2013-07-28 NOTE — H&P (Addendum)
[facility] History and Physical  [name] YQM:[id number] DOB: 1923/03/30 DOA: 07/28/2013   PCP: Rubbie Battiest, MD while she was in [place]. She has a new doctor in [place] where she moved to live with her daughter few months ago. Specialists: She is followed by Dr. Lovena Le. She hasn't set up a cardiologist in [place] Complaint: Chest pressure and worsening shortness of breath  HPI: [name] is a 80 y.o. female with a past medical history of nonischemic cardiomyopathy, with a known EF of about 25% based on a cardiac catheterization from 2011. No recent echocardiogram data is available in the computer. She was in her usual state of health till about 2 weeks ago, when she started noticing chest pressure on and off. It would get worse with activity. She also had shortness of breath. Would get worse with exertion. She also noticed that it would get worse when she was lying flat on the bed. She denies any symptoms of PND. She has noticed increased swelling in her legs as well. Denies any nausea, vomiting, no fever, no chills. She says her weight is about the same. Denies any dizziness or lightheadedness. The chest pressure is located in the center of the chest and is 4-5/10 in intensity. Earlier today it radiated briefly to her neck and jaw. She tells me that about 3 weeks ago she was taken off of her spinal lactone by her primary care physician in [place] due to abnormal Liver blood tests and was started on 20 mg daily of furosemide.  Home Medications: Prior to Admission medications   Medication Sig Start Date End Date Taking? Authorizing Provider  amiodarone (PACERONE) 200 MG tablet Take 1 tablet (200 mg total) by mouth 2 (two) times daily. 07/18/13  Yes Lendon Colonel, NP  benazepril (LOTENSIN) 10 MG tablet Take 10 mg by mouth every morning.   Yes Historical Provider, MD  carvedilol (COREG) 3.125 MG tablet Take 1 tablet (3.125 mg total) by mouth  2 (two) times daily with a meal. 03/21/13  Yes Lendon Colonel, NP  Cholecalciferol (VITAMIN D-3) 1000 UNITS CAPS Take 1,000 Units by mouth every morning.    Yes Historical Provider, MD  furosemide (LASIX) 20 MG tablet Take 20 mg by mouth every morning.   Yes Historical Provider, MD  Multiple Vitamin (MULTIVITAMIN WITH MINERALS) TABS tablet Take 1 tablet by mouth every morning.   Yes Historical Provider, MD  naproxen (NAPROSYN) 500 MG tablet Take 500 mg by mouth 2 (two) times daily with a meal.   Yes Historical Provider, MD    Allergies:  Allergies  Allergen Reactions  . Lipitor [Atorvastatin]     Leg pain  . Pravachol [Pravastatin Sodium]     Headache  . Zetia [Ezetimibe]     Afraid    Past Medical History: Past Medical History  Diagnosis Date  . Glaucoma   . CHF (congestive heart failure)   . HTN (hypertension)   . Tremor     sees Dr. Merlene Laughter, neurologist  . Arthritis     left hip-degeneration  . Hip pain, left   . Paroxysmal ventricular tachycardia 09/23/2008    Centricity Description: VENTRICULAR TACHYCARDIA Qualifier: Diagnosis of  By: Via LPN, Jeani Hawking   Centricity Description: PAROXYSMAL VENTRICULAR TACHYCARDIA Qualifier: Diagnosis of  By: Via LPN, Jeani Hawking    . Nonischemic cardiomyopathy 09/23/2008    Cardiac catheterization in 2011: EF of 25%; normal coronary angiography.    . Automatic implantable cardiac defibrillator  in situ 09/23/2008    Appropriate discharges in 2011 and 01/2012   . Dizziness 01/15/2012  . UTI (urinary tract infection) 01/15/2012  . Anxiety   . Hyperlipidemia   . Osteoporosis   . Pacemaker   . Cardiac defibrillator in place   . Shoulder joint pain     difficulty raising arm-right  . High cholesterol   . Heart disease   . A-fib     Past Surgical History  Procedure Laterality Date  . Icd implantation  2003    [employer]; remote - no  . Ankle fracture surgery  1992    left  . Bladder suspension  1994  . Abdominal hysterectomy  1973     partial  . Tonsillectomy  ages 30 and 84    [place]  . Cataract extraction w/phaco  06/15/2011    Procedure: CATARACT EXTRACTION PHACO AND INTRAOCULAR LENS PLACEMENT (IOC);  Surgeon: Tonny Branch, MD;  Location: [facility];  Service: Ophthalmology;  Laterality: Right;  CDE:20.26  . Cataract extraction w/phaco  09/14/2011    Procedure: CATARACT EXTRACTION PHACO AND INTRAOCULAR LENS PLACEMENT (IOC);  Surgeon: Tonny Branch, MD;  Location: [facility];  Service: Ophthalmology;  Laterality: Left;  CDE 31.96  . Heart  surgery  2003    gadgets implant  . Ankle surgery  1992    broken     Social History: Patient usually lives with her daughter in [place], but is currently visiting and living with her son for the last few weeks in [place]. She plans to return to [place] in 2 weeks. No smoking, alcohol use illicit drug use. Uses a walker to ambulate.  Family History:  Family History  Problem Relation Age of Onset  . Coronary artery disease      family history; female <65  . Cancer Father     Lung  . Asthma Mother      Review of Systems - History obtained from the patient General ROS: positive for  - fatigue Psychological ROS: positive for - anxiety Ophthalmic ROS: negative ENT ROS: negative Allergy and Immunology ROS: negative Hematological and Lymphatic ROS: negative Endocrine ROS: negative Respiratory ROS: as in hpi Cardiovascular ROS: as in hpi Gastrointestinal ROS: no abdominal pain, change in bowel habits, or black or bloody stools Genito-Urinary ROS: no dysuria, trouble voiding, or hematuria Musculoskeletal ROS: negative Neurological ROS: no TIA or stroke symptoms Dermatological ROS: negative  Physical Examination  Filed Vitals:   07/28/13 1749 07/28/13 2023  BP: 113/96 134/70  Pulse: 100   Temp: 97.9 F (36.6 C)   TempSrc: Oral   Resp: 18   Height: '5\' 4"'  (1.626 m)   Weight: 52.164 kg (115 lb)   SpO2: 95%     BP 134/70  Pulse 100  Temp(Src) 97.9 F (36.6 C)  (Oral)  Resp 18  Ht '5\' 4"'  (1.626 m)  Wt 52.164 kg (115 lb)  BMI 19.73 kg/m2  SpO2 95%  General appearance: alert, cooperative, appears stated age and no distress Head: Normocephalic, without obvious abnormality, atraumatic Eyes: conjunctivae/corneas clear. PERRL, EOM's intact. Throat: lips, mucosa, and tongue normal; teeth and gums normal Neck: no adenopathy, no carotid bruit, no JVD, supple, symmetrical, trachea midline and thyroid not enlarged, symmetric, no tenderness/mass/nodules Resp: Decreased air entry bilateral bases with crackles. No wheezing. Cardio: regular rate and rhythm, S1, S2 normal, no murmur, click, rub or gallop GI: soft, non-tender; bowel sounds normal; no masses,  no organomegaly Extremities: minimal edema bilateral LE. No  pitting noted. Pulses: 2+ and symmetric Skin: Skin color, texture, turgor normal. No rashes or lesions Lymph nodes: Cervical, supraclavicular, and axillary nodes normal. Neurologic: No focal deficits.  Laboratory Data: Results for orders placed during the hospital encounter of 07/28/13 (from the past 48 hour(s))  CBC     Status: Abnormal   Collection Time    07/28/13  6:42 PM      Result Value Ref Range   WBC 7.2  4.0 - 10.5 K/uL   RBC 3.92  3.87 - 5.11 MIL/uL   Hemoglobin 11.8 (*) 12.0 - 15.0 g/dL   HCT 36.5  36.0 - 46.0 %   MCV 93.1  78.0 - 100.0 fL   MCH 30.1  26.0 - 34.0 pg   MCHC 32.3  30.0 - 36.0 g/dL   RDW 14.3  11.5 - 15.5 %   Platelets 268  150 - 400 K/uL  TROPONIN I     Status: None   Collection Time    07/28/13  6:42 PM      Result Value Ref Range   Troponin I <0.30  <0.30 ng/mL   Comment:            Due to the release kinetics of cTnI,     a negative result within the first hours     of the onset of symptoms does not rule out     myocardial infarction with certainty.     If myocardial infarction is still suspected,     repeat the test at appropriate intervals.  BASIC METABOLIC PANEL     Status: Abnormal   Collection  Time    07/28/13  6:42 PM      Result Value Ref Range   Sodium 141  137 - 147 mEq/L   Potassium 4.6  3.7 - 5.3 mEq/L   Chloride 101  96 - 112 mEq/L   CO2 29  19 - 32 mEq/L   Glucose, Bld 89  70 - 99 mg/dL   BUN 21  6 - 23 mg/dL   Creatinine, Ser 1.02  0.50 - 1.10 mg/dL   Calcium 8.6  8.4 - 10.5 mg/dL   GFR calc non Af Amer 48 (*) >90 mL/min   GFR calc Af Amer 56 (*) >90 mL/min   Comment: (NOTE)     The eGFR has been calculated using the CKD EPI equation.     This calculation has not been validated in all clinical situations.     eGFR's persistently <90 mL/min signify possible Chronic Kidney     Disease.  PRO B NATRIURETIC PEPTIDE     Status: Abnormal   Collection Time    07/28/13  6:42 PM      Result Value Ref Range   Pro B Natriuretic peptide (BNP) 2247.0 (*) 0 - 450 pg/mL    Radiology Reports: Dg Chest 2 View  07/28/2013   CLINICAL DATA:  Chest pain, weakness.  EXAM: CHEST  2 VIEW  COMPARISON:  01/17/2012  FINDINGS: Left AICD remains in place, unchanged. Cardiomegaly. Bibasilar atelectasis or scarring. Small bilateral pleural effusions present. No acute bony abnormality. Stable elevation of the right hemidiaphragm.  IMPRESSION: Bibasilar atelectasis or scarring. Small bilateral pleural effusions.  Cardiomegaly.   Electronically Signed   By: Rolm Baptise M.D.   On: 07/28/2013 19:28    Electrocardiogram: Paced rhythm in the 50s.  Problem List  Principal Problem:   Systolic CHF, acute Active Problems:   Nonischemic cardiomyopathy   Essential tremor  Ventricular tachycardia   Right shoulder pain   Assessment: This is an 80 year old, Caucasian female, who comes in with progressively worsening shortness of breath, and chest pressure. EKG is paced. Troponin is normal. She has bilateral pleural effusions on chest x-ray. I think this is most likely mild acute exacerbation of her systolic CHF. Recent medication change could have contributed to the problem. Review of her old  records suggests that she might have gained up to 2 kg in the last 3-4 months.  Plan: #1 acute systolic CHF: We will place her on intravenous Lasix. Ins and outs will be monitored closely. Cardiology will be consulted in the morning. Echocardiogram will be obtained. Continue with beta blocker and ARB. She is noted to be bradycardic with normal BP. She'll be monitored on telemetry. She is paced.  #2 chest pressure: Most likely due to the CHF. Troponin is normal. Will continue to cycle.  #3 history of ventricular tachycardia: Continue with amiodarone. Check TSH.  DVT Prophylaxis: Lovenox Code Status: Full code. This was discussed with patient. Family Communication: Discussed with the patient and her son  Disposition Plan: Admit to telemetry   Further management decisions will depend on results of further testing and patient's response to treatment.   [facility]  [facility] Pager [phone]  If 7PM-7AM, please contact night-coverage [email or web address] Password [facility]  07/28/2013, 9:17 PM  Disclaimer: This note was dictated with voice recognition software. Similar sounding words can inadvertently be transcribed and may not be corrected upon review.

## 2013-07-28 NOTE — ED Provider Notes (Signed)
CSN: [id number]     Arrival date & time 07/28/13  1746 History   First MD Initiated Contact with Patient 07/28/13 1829     Chief Complaint  Patient presents with  . Chest Pain      HPI Pt was seen at 1835. Per pt and her family, c/o gradual onset and worsening of persistent chest "pain" and SOB for the past 1 week. Pt describes the CP as "pressure," located in her mid-sternal area. Symptoms worsen with laying down flat and ambulation. Has been associated with increasing pedal edema unresponsive to her lasix and elevation of her legs. Denies palpitations, no cough, no abd pain, no N/V/D, no back pain.    Past Medical History  Diagnosis Date  . Glaucoma   . CHF (congestive heart failure)   . HTN (hypertension)   . Tremor     sees Dr. Gerilyn Pilgr[place]oonquah, neurologist  . Arthritis     left hip-degeneration  . Hip pain, left   . Paroxysmal ventricular tachycardia 09/23/2008    Centricity Description: VENTRICULAR TACHYCARDIA Qualifier: Diagnosis of  By: Via LPN, Larita FifeLynn   Centricity Description: PAROXYSMAL VENTRICULAR TACHYCARDIA Qualifier: Diagnosis of  By: Via LPN, Larita FifeLynn    . Nonischemic cardiomyopathy 09/23/2008    Cardiac catheterization in 2011: EF of 25%; normal coronary angiography.    . Automatic implantable cardiac defibrillator in situ 09/23/2008    Appropriate discharges in 2011 and 01/2012   . Dizziness 01/15/2012  . UTI (urinary tract infection) 01/15/2012  . Anxiety   . Hyperlipidemia   . Osteoporosis   . Pacemaker   . Cardiac defibrillator in place   . Shoulder joint pain     difficulty raising arm-right  . High cholesterol   . Heart disease   . A-fib    Past Surgical History  Procedure Laterality Date  . Icd implantation  2003    [employer]; remote - no  . Ankle fracture surgery  1992    left  . Bladder suspension  1994  . Abdominal hysterectomy  1973    partial  . [place]  ages 89 [place] [age]    [place]  . Cataract extraction w/phaco  06/15/2011    Procedure:  CATARACT EXTRACTION PHACO AND INTRAOCULAR LENS PLACEMENT (IOC);  Surgeon: Gemma PayorKerry Hunt, MD;  Location: [facility];  Service: Ophthalmology;  Laterality: Right;  CDE:20.26  . Cataract extraction w/phaco  09/14/2011    Procedure: CATARACT EXTRACTION PHACO AND INTRAOCULAR LENS PLACEMENT (IOC);  Surgeon: Gemma PayorKerry Hunt, MD;  Location: [facility];  Service: Ophthalmology;  Laterality: Left;  CDE 31.96  . Heart  surgery  2003    gadgets implant  . Ankle surgery  1992    broken    Family History  Problem Relation Age of Onset  . Coronary artery disease      family history; female <65  . Cancer Father     Lung  . Asthma Mother    History  Substance Use Topics  . Smoking status: Never Smoker   . Smokeless tobacco: Never Used     Comment: tobacco - use  . Alcohol Use: No    Review of Systems ROS: Statement: All systems negative except as marked or noted in the HPI; Constitutional: Negative for fever and chills. ; ; Eyes: Negative for eye pain, redness and discharge. ; ; ENMT: Negative for ear pain, hoarseness, nasal congestion, sinus pressure and sore throat. ; ; Cardiovascular: Negative for palpitations, diaphoresis, +CP, dyspnea and peripheral edema. ; ;  Respiratory: Negative for cough, wheezing and stridor. ; ; Gastrointestinal: Negative for nausea, vomiting, diarrhea, abdominal pain, blood in stool, hematemesis, jaundice and rectal bleeding. ; ; Genitourinary: Negative for dysuria, flank pain and hematuria. ; ; Musculoskeletal: Negative for back pain and neck pain. Negative for swelling and trauma.; ; Skin: Negative for pruritus, rash, abrasions, blisters, bruising and skin lesion.; ; Neuro: Negative for headache, lightheadedness and neck stiffness. Negative for weakness, altered level of consciousness , altered mental status, extremity weakness, paresthesias, involuntary movement, seizure and syncope.       Allergies  Lipitor; Pravachol; and Zetia  Home Medications   Prior to Admission medications    Medication Sig Start Date End Date Taking? Authorizing Provider  amiodarone (PACERONE) 200 MG tablet Take 1 tablet (200 mg total) by mouth 2 (two) times daily. 07/18/13  Yes Jodelle Gross, NP  benazepril (LOTENSIN) 10 MG tablet Take 10 mg by mouth every morning.   Yes Historical Provider, MD  carvedilol (COREG) 3.125 MG tablet Take 1 tablet (3.125 mg total) by mouth 2 (two) times daily with a meal. 03/21/13  Yes Jodelle Gross, NP  Cholecalciferol (VITAMIN D-3) 1000 UNITS CAPS Take 1,000 Units by mouth every morning.    Yes Historical Provider, MD  furosemide (LASIX) 20 MG tablet Take 20 mg by mouth every morning.   Yes Historical Provider, MD  Multiple Vitamin (MULTIVITAMIN WITH MINERALS) TABS tablet Take 1 tablet by mouth every morning.   Yes Historical Provider, MD  naproxen (NAPROSYN) 500 MG tablet Take 500 mg by mouth 2 (two) times daily with a meal.   Yes Historical Provider, MD   BP 134/70  Pulse 100  Temp(Src) 97.9 F (36.6 C) (Oral)  Resp 18  Ht 5\' 4"  (1.626 m)  Wt 115 lb (52.164 kg)  BMI 19.73 kg/m2  SpO2 95% Physical Exam 1840: Physical examination:  Nursing notes reviewed; Vital signs and O2 SAT reviewed;  Constitutional: Well developed, Well nourished, In no acute distress; Head:  Normocephalic, atraumatic; Eyes: EOMI, PERRL, No scleral icterus; ENMT: Mouth and pharynx normal, Mucous membranes dry; Neck: Supple, Full range of motion, No lymphadenopathy; Cardiovascular: Regular rate and rhythm, No gallop; Respiratory: Breath sounds clear & equal bilaterally, No rales, rhonchi, wheezes.  Speaking full sentences with ease, Normal respiratory effort/excursion; Chest: Nontender, Movement normal; Abdomen: Soft, Nontender, Nondistended, Normal bowel sounds; Genitourinary: No CVA tenderness; Extremities: Pulses normal, No tenderness, +1 pedal edema bilat, No calf edema or asymmetry.; Neuro: AA&Ox3, Major CN grossly intact.  Speech clear. No gross focal motor or sensory deficits in  extremities.; Skin: Color normal, Warm, Dry.; Psych:  Anxious, tearful.     ED Course  Procedures     EKG Interpretation   Date/Time:  Monday July 28 2013 17:58:37 EDT Ventricular Rate:  59 PR Interval:  118 QRS Duration: 188 QT Interval:  558 QTC Calculation: 552 R Axis:   -85 Text Interpretation:  AV dual-paced rhythm Biventricular pacemaker  detected Abnormal ECG When compared with ECG of 01/18/2012 No significant  change was found Confirmed by [facility]  MD, Nicholos Johns [phone]) on 07/28/2013  7:22:43 PM      MDM  MDM Reviewed: previous chart, nursing note and vitals Reviewed previous: labs and ECG Interpretation: ECG, x-ray and labs    Results for orders placed during the hospital encounter of 07/28/13  CBC      Result Value Ref Range   WBC 7.2  4.0 - 10.5 K/uL   RBC 3.92  3.87 - 5.11  MIL/uL   Hemoglobin 11.8 (*) 12.0 - 15.0 g/dL   HCT 16.1  09.6 - 04.5 %   MCV 93.1  78.0 - 100.0 fL   MCH 30.1  26.0 - 34.0 pg   MCHC 32.3  30.0 - 36.0 g/dL   RDW 40.9  81.1 - 91.4 %   Platelets 268  150 - 400 K/uL  TROPONIN I      Result Value Ref Range   Troponin I <0.30  <0.30 ng/mL  BASIC METABOLIC PANEL      Result Value Ref Range   Sodium 141  137 - 147 mEq/L   Potassium 4.6  3.7 - 5.3 mEq/L   Chloride 101  96 - 112 mEq/L   CO2 29  19 - 32 mEq/L   Glucose, Bld 89  70 - 99 mg/dL   BUN 21  6 - 23 mg/dL   Creatinine, Ser 7.82  0.50 - 1.10 mg/dL   Calcium 8.6  8.4 - 95.6 mg/dL   GFR calc non Af Amer 48 (*) >90 mL/min   GFR calc Af Amer 56 (*) >90 mL/min  PRO B NATRIURETIC PEPTIDE      Result Value Ref Range   Pro B Natriuretic peptide (BNP) 2247.0 (*) 0 - 450 pg/mL   Dg Chest 2 View 07/28/2013   CLINICAL DATA:  Chest pain, weakness.  EXAM: CHEST  2 VIEW  COMPARISON:  01/17/2012  FINDINGS: Left AICD remains in place, unchanged. Cardiomegaly. Bibasilar atelectasis or scarring. Small bilateral pleural effusions present. No acute bony abnormality. Stable elevation of the  right hemidiaphragm.  IMPRESSION: Bibasilar atelectasis or scarring. Small bilateral pleural effusions.  Cardiomegaly.   Electronically Signed   By: Charlett Nose M.D.   On: 07/28/2013 19:28    2020:  Feels improved after ASA and SL ntg. BNP elevated compared to previous; IV lasix given. Dx and testing d/w pt and family.  Questions answered.  Verb understanding, agreeable to admit. T/C to [facility]. Rito Ehrlich, case discussed, including:  HPI, pertinent PM/SHx, VS/PE, dx testing, ED course and treatment:  Agreeable to admit, requests to write temporary orders, obtain inpt tele bed to team 1.    Laray Anger, DO 07/31/13 1720

## 2013-07-29 DIAGNOSIS — I319 Disease of pericardium, unspecified: Secondary | ICD-10-CM

## 2013-07-29 LAB — TROPONIN I: Troponin I: <0.3 ng/mL (ref <0.30)

## 2013-07-29 LAB — COMPREHENSIVE METABOLIC PANEL
ALK PHOS: 87 U/L (ref 39–117)
ALT: 37 U/L — ABNORMAL HIGH (ref 0–35)
AST: 27 U/L (ref 0–37)
Albumin: 2.9 g/dL — ABNORMAL LOW (ref 3.5–5.2)
BUN: 19 mg/dL (ref 6–23)
CO2: 33 meq/L — AB (ref 19–32)
Calcium: 8.8 mg/dL (ref 8.4–10.5)
Chloride: 100 mEq/L (ref 96–112)
Creatinine, Ser: 1.02 mg/dL (ref 0.50–1.10)
GFR calc non Af Amer: 48 mL/min — ABNORMAL LOW (ref >90)
GFR, EST AFRICAN AMERICAN: 56 mL/min — AB (ref >90)
GLUCOSE: 98 mg/dL (ref 70–99)
Potassium: 3.9 mEq/L (ref 3.7–5.3)
Sodium: 143 mEq/L (ref 137–147)
Total Bilirubin: 0.3 mg/dL (ref 0.3–1.2)
Total Protein: 6.1 g/dL (ref 6.0–8.3)

## 2013-07-29 LAB — CBC
HEMATOCRIT: 38.3 % (ref 36.0–46.0)
Hemoglobin: 12.4 g/dL (ref 12.0–15.0)
MCH: 30.2 pg (ref 26.0–34.0)
MCHC: 32.4 g/dL (ref 30.0–36.0)
MCV: 93.2 fL (ref 78.0–100.0)
Platelets: 244 10*3/uL (ref 150–400)
RBC: 4.11 MIL/uL (ref 3.87–5.11)
RDW: 14.2 % (ref 11.5–15.5)
WBC: 8.2 10*3/uL (ref 4.0–10.5)

## 2013-07-29 LAB — TSH: TSH: 4.31 u[IU]/mL (ref 0.350–4.500)

## 2013-07-29 MED ORDER — ENOXAPARIN SODIUM 30 MG/0.3ML ~~LOC~~ SOLN
30.0000 mg | SUBCUTANEOUS | Status: DC
  Administered 2013-07-29: 30 mg via SUBCUTANEOUS
  Filled 2013-07-29: qty 0.3

## 2013-07-29 NOTE — Evaluation (Signed)
Physical Therapy Evaluation Patient Details Name: [name] MRN: [id number] DOB: 1924/01/29 Today's Date: 07/29/2013   History of Present Illness  [name] is a 80 y.o. female with a past medical history of nonischemic cardiomyopathy, with a known EF of about 25% based on a cardiac catheterization from 2011. No recent echocardiogram data is available in the computer. She was in her usual state of health till about 2 weeks ago, when she started noticing chest pressure on and off. It would get worse with activity. She also had shortness of breath. Would get worse with exertion. She also noticed that it would get worse when she was lying flat on the bed. She denies any symptoms of PND. She has noticed increased swelling in her legs as well. Denies any nausea, vomiting, no fever, no chills. She says her weight is about the same. Denies any dizziness or lightheadedness. The chest pressure is located in the center of the chest and is 4-5/10 in intensity. Earlier today it radiated briefly to her neck and jaw. She tells me that about 3 weeks ago she was taken off of her spinal lactone by her primary care physician in [place] due to abnormal Liver blood tests and was started on 20 mg daily of furosemide.  Clinical Impression  Patient presents to PT after MD referral for assessment of mobility skills.  Patient currently lives with her son, who is able to help PRN; she will be moving back in with her daughter in ~10 days.  Currently, she lives in a single story home with 3 steps to enter the garage door with a handrail on the Lt.  She reports she is mod (I) with bed mobility skills, transfers, and household amb skills with use of her RW.  During evaluation, the patient was mod (I) with use of 1 handrail (has a handrail on bed at home) with bed mobility skills, and mod (I) for transfers with use of RW and multiple attempts to transfer to stand.  Patient reports she usually stands up a little  bit easier.  She was able to amb ~15 feet with step to gait pattern and use of RW; noted crepitus in Lt hip/knee during WB activity.  Patient had to sit down to rest secondary to fatigue with activity.  Recommend continued PT while in the hospital to increase strength and activity tolerance for improvement performance of functional mobility skills, with transition to HHPT for additional rehab at discharge.  Recommend 3-in-1 commode for home use, and possible shower seat to assist with ADLs as home does not have grab bars by toilet.     Follow Up Recommendations Home health PT    Equipment Recommendations  3in1 (PT) (Shower seat?)    Recommendations for Other Services OT consult     Precautions / Restrictions Precautions Precautions: ICD/Pacemaker Restrictions Weight Bearing Restrictions: No      Mobility  Bed Mobility Overal bed mobility: Modified Independent             General bed mobility comments: Uses bed rail (has bed rail on bed at home), increased time to complete task  Transfers Overall transfer level: Modified independent Equipment used: Rolling walker (2 wheeled)             General transfer comment: Increased time to complete task, use of multiple attempts prior to standing  Ambulation/Gait Ambulation/Gait assistance: Min guard Ambulation Distance (Feet): 15 Feet Assistive device: Rolling walker (2 wheeled) Gait Pattern/deviations: Step-to pattern  Gait velocity interpretation: Below normal speed for age/gender (Patient reports this is her normal speed) General Gait Details: Noted crepitus in Lt hip/knee with WB during gait        Balance Overall balance assessment: Needs assistance Sitting-balance support: Feet supported Sitting balance-Leahy Scale: Good Sitting balance - Comments: Able to maintain sitting at EOB without LOB during mild pertebations   Standing balance support: Bilateral upper extremity supported;During functional activity (on RW  during standing/gait) Standing balance-Leahy Scale: Fair                               Pertinent Vitals/Pain Patient reports chronic pain in Lt hip, at 5/10.  RN made aware, and patient repositioned from bed to chair to ease pain.     Home Living Family/patient expects to be discharged to:: Private residence Living Arrangements: Children (Currently lives with son (home assessment of sons home), will move back in with daughter in ~10 days) Available Help at Discharge: Family;Available PRN/intermittently Type of Home: House Home Access: Stairs to enter Entrance Stairs-Rails: Left Entrance Stairs-Number of Steps: 3 Home Layout: One level Home Equipment: Walker - 2 wheels;Toilet riser;Wheelchair - manual Additional Comments: Walk in shower - patient takes walker into shower; Bed has 1 handrail on it    Prior Function Level of Independence: Independent with assistive device(s)               Hand Dominance   Dominant Hand: Right (Hx of Rt RTC tear ~1 year ago, so now uses Lt UE for many tasks)    Extremity/Trunk Assessment   Upper Extremity Assessment: Defer to OT evaluation (Hx of Rt RTC tear and inability to activity raise arm)           Lower Extremity Assessment: Generalized weakness;RLE deficits/detail;LLE deficits/detail RLE Deficits / Details: MMT hip 3+/5, knee/ankle 4/5 LLE Deficits / Details: MMT hip 3-/5, knee 4-/5, ankle 4/5     Communication   Communication: No difficulties  Cognition Arousal/Alertness: Awake/alert Behavior During Therapy: WFL for tasks assessed/performed Overall Cognitive Status: Within Functional Limits for tasks assessed                               Assessment/Plan    PT Assessment Patient needs continued PT services  PT Diagnosis Difficulty walking;Generalized weakness   PT Problem List Decreased strength;Decreased activity tolerance;Decreased balance;Decreased mobility;Cardiopulmonary status limiting  activity  PT Treatment Interventions Balance training;Gait training;Neuromuscular re-education;Stair training;Functional mobility training;Therapeutic activities;Therapeutic exercise   PT Goals (Current goals can be found in the Care Plan section) Acute Rehab PT Goals Patient Stated Goal: get better PT Goal Formulation: With patient Time For Goal Achievement: 08/12/13 Potential to Achieve Goals: Good    Frequency Min 3X/week    End of Session Equipment Utilized During Treatment: Gait belt Activity Tolerance: Patient limited by fatigue;Other (comment) ("heaviness" in chest limiting activity) Patient left: in chair;with call bell/phone within reach;with chair alarm set           Time: [id number] PT Time Calculation (min): 23 min   Charges:   PT Evaluation $Initial PT Evaluation Tier I: 1 Procedure      WOODWORTH,STEPHANIE 07/29/2013, 8:55 AM

## 2013-07-29 NOTE — Care Management Utilization Note (Signed)
UR completed 

## 2013-07-29 NOTE — Progress Notes (Signed)
  Echocardiogram 2D Echocardiogram has been performed.  RIGG, CYNTHIA 07/29/2013, 11:17 AM

## 2013-07-29 NOTE — Evaluation (Signed)
Occupational Therapy Evaluation Patient Details Name: Meyer CoryMildred M Wildey MRN: [id number] DOB: 1923/04/24 Today's Date: 07/29/2013    History of Present Illness Meyer CoryMildred M Vicknair is a 80 y.o. female with a past medical history of nonischemic cardiomyopathy, with a known EF of about 25% based on a cardiac catheterization from 2011. No recent echocardiogram data is available in the computer. She was in her usual state of health till about 2 weeks ago, when she started noticing chest pressure on and off. It would get worse with activity. She also had shortness of breath. Would get worse with exertion. She also noticed that it would get worse when she was lying flat on the bed. She denies any symptoms of PND. She has noticed increased swelling in her legs as well. Denies any nausea, vomiting, no fever, no chills. She says her weight is about the same. Denies any dizziness or lightheadedness. The chest pressure is located in the center of the chest and is 4-5/10 in intensity. Earlier today it radiated briefly to her neck and jaw. She tells me that about 3 weeks ago she was taken off of her spinal lactone by her primary care physician in [place] due to abnormal Liver blood tests and was started on 20 mg daily of furosemide.   Clinical Impression   Pt presenting to acute OT with above situation.  She is at a modified independent level with ADLs and functional transfers, verbalizing only needing assist for shower transfers at baseline.  Pt is near baseline functioning, and does not believes she needs further OT services (OTR offered HHOT for home safety evaluation).  Provided pt with educated on energy conservation strategies (provided handout).  Pt needs no further OT services at this time.    Follow Up Recommendations  No OT follow up    Equipment Recommendations  3 in 1 bedside comode (pt verbalizes having shower chair)    Recommendations for Other Services       Precautions / Restrictions  Precautions Precautions: ICD/Pacemaker Restrictions Weight Bearing Restrictions: No      Mobility Bed Mobility Overal bed mobility: Modified Independent             General bed mobility comments: Uses bed rail (has bed rail on bed at home), increased time to complete task  Transfers Overall transfer level: Modified independent Equipment used: Rolling walker (2 wheeled)             General transfer comment: Increased time to complete amublation task. Able to ambulate from chair to bathroom (12+ feet)    Balance Overall balance assessment: Needs assistance Sitting-balance support: Feet supported Sitting balance-Leahy Scale: Good Sitting balance - Comments: Able to maintain sitting at EOB without LOB during mild pertebations   Standing balance support: Single extremity supported;During functional activity Standing balance-Leahy Scale: Fair                              ADL Overall ADL's : Modified independent;At baseline                                       General ADL Comments: pt demonstrated ability to stand, amublate to toilet with RW, complete toilet hygiene, wash hands at the sink, and return to sitting in her chair. Pt utilized RW and requred extra time, but was able to complete safely.  Pt  became fatigued with activity.     Vision                     Perception     Praxis      Pertinent Vitals/Pain Pt denies pain     Hand Dominance Right (RUE injury due to fall approximatley 1year ago - now uses LUE for some tasks.)   Extremity/Trunk Assessment Upper Extremity Assessment Upper Extremity Assessment: Generalized weakness;RUE deficits/detail RUE Deficits / Details: Hx rotator Cuff tear after fall approximately 1 year ago.  Surgery was contraindicated, so pt has decreased ROM, strength, and functional use in RUE   Lower Extremity Assessment Lower Extremity Assessment: Defer to PT evaluation RLE Deficits / Details:  MMT hip 3+/5, knee/ankle 4/5 LLE Deficits / Details: MMT hip 3-/5, knee 4-/5, ankle 4/5       Communication Communication Communication: No difficulties   Cognition Arousal/Alertness: Awake/alert Behavior During Therapy: WFL for tasks assessed/performed Overall Cognitive Status: Within Functional Limits for tasks assessed                     General Comments       Exercises       Shoulder Instructions      Home Living Family/patient expects to be discharged to:: Private residence Living Arrangements: Children (lives with daughter and son-in-law in [place]) Available Help at Discharge: Family;Available PRN/intermittently Type of Home: House Home Access: Stairs to enter [employer] of Steps: 5 in garage Entrance Stairs-Rails: Can reach both Home Layout: One level     Bathroom Shower/Tub: Producer, television/film/video: Handicapped height     Home Equipment: Environmental consultant - 2 wheels;Shower seat;Cane - single point Theme park manager)   Additional Comments: Walk in shower - patient takes walker into shower; Bed has 1 handrail on it      Prior Functioning/Environment Level of Independence: Independent with assistive device(s)        Comments: Has needed assist to transfer into shower. pt no longer drives,    OT Diagnosis:     OT Problem List:     OT Treatment/Interventions:      OT Goals(Current goals can be found in the care plan section) Acute Rehab OT Goals Patient Stated Goal: No OT goals needed OT Goal Formulation: With patient  OT Frequency:     Barriers to D/C:            Co-evaluation              End of Session Equipment Utilized During Treatment: Gait belt;Rolling walker  Activity Tolerance: Patient tolerated treatment well Patient left: in chair;with call bell/phone within reach;with chair alarm set   Time: 0912-0956 OT Time Calculation (min): 44 min Charges:  OT General Charges $OT Visit: 1 Procedure OT  Evaluation $Initial OT Evaluation Tier I: 1 Procedure G-Codes:     Marry Guan, MS, OTR/L [phone]  07/29/2013, 10:12 AM

## 2013-07-29 NOTE — Care Management Note (Unsigned)
    Page 1 of 1   07/29/2013     1:27:48 PM CARE MANAGEMENT NOTE 07/29/2013  Patient:  [name], [name]   Account Number:  [id number]  Date Initiated:  07/29/2013  Documentation initiated by:  Rosemary Holms  Subjective/Objective Assessment:   Very pleasant lady who lives part of the time in [place] with her daughter and the other time with her son here in [place].     Action/Plan:   Offered HH PT and 3 in 1 but pt declined.   Anticipated DC Date:     Anticipated DC Plan:  HOME/SELF CARE      DC Planning Services  CM consult      Choice offered to / List presented to:             Status of service:  In process, will continue to follow Medicare Important Message given?   (If response is "NO", the following Medicare IM given date fields will be blank) Date Medicare IM given:   Date Additional Medicare IM given:    Discharge Disposition:    Per UR Regulation:    If discussed at Long Length of Stay Meetings, dates discussed:    Comments:  07/29/13 Rosemary Holms RN BSN CM

## 2013-07-29 NOTE — Consult Note (Signed)
CARDIOLOGY CONSULT NOTE   Patient ID: [name] MRN: [id number] DOB/AGE: August 24, 1923 80 y.o.  Admit Date: 07/28/2013 Referring Physician: PTH Primary Physician: Harlow Asa, MD Consulting Cardiologist: Dina Rich MD Primary Cardiologist : Lewayne Bunting MD Reason for Consultation: Chest Pain  Clinical Summary [name] is a 80 y.o.female with known history of nonischemic heart myopathy, chronic systolic CHF with an EF of 25%, complete heart block, status post ICD pacemaker [employer] placed in 2013), history of hypokalemia, and multiple other medical problems, to include Parkinson's disease. The patient called our office on 07/28/2048 with complaints of chest pain radiating to the jaw, she was emotionally upset and crying and was referred to the emergency room.  The patient states that she came down to [place] to visit her son and has been here approximately 3 weeks, she began have swelling in her feet on the drive down from [place], but the swelling never went away as he usually does and she keeps her feet. She has had prolonged pressure in her chest which has been constant, she states she also noticed a rattling in her chest about a week ago but ignored it. She states that she was unable to lie flat and would have to turn on her side to breathe. She also began to complain of worsening dyspnea on exertion. She is frail, and often has trouble breathing with walking, but noticed that this had gotten substantially worse.  The patient had been followed in our office until approximately 3 months ago when she moved to [place] to live with her daughter. She is followed by her primary care physician there who made some medication adjustments. She had been on spironolactone, but this was discontinued due to abnormal liver enzymes for what she says her physician told her, and she was begun on Lasix 20 mg daily.   On arrival to the emergency room  the patient's blood pressure was 113/96 heart rate 100 O2 sat 95% with a temperature of 97.9. Review of labs were essentially unremarkable with mild decrease in hemoglobin of 11.8, proBNP 2247. Chest x-ray revealed bibasilar atelectasis or scarring, with a small bilateral pleural effusion comment cardiomegaly. She was treated with aspirin 324 mg and Lasix 40 mg IV.  Cardiac enzymes have been negative x3. [name] revealed AV pacing with what appeared to be underlying atrial fib. Pacemaker was last interrogated by our office in September of 2014. Amiodarone dose was decreased to 200 mg daily. She had a history of ventricular tachycardia. She was continued on carvedilol and benazepril. Echocardiogram is in progress.   Allergies  Allergen Reactions  . Lipitor [Atorvastatin]     Leg pain  . Pravachol [Pravastatin Sodium]     Headache  . Zetia [Ezetimibe]     Afraid    Medications Scheduled Medications: . amiodarone  200 mg Oral BID  . aspirin EC  81 mg Oral Daily  . benazepril  10 mg Oral q morning - 10a  . carvedilol  3.125 mg Oral BID WC  . enoxaparin (LOVENOX) injection  40 mg Subcutaneous Q24H  . furosemide  40 mg Intravenous Daily  . sodium chloride  3 mL Intravenous Q12H    Infusions:    PRN Medications: sodium chloride, acetaminophen, ondansetron (ZOFRAN) IV, sodium chloride   Past Medical History  Diagnosis Date  . Glaucoma   . CHF (congestive heart failure)   . HTN (hypertension)   . Tremor     sees Dr. Gerilyn Pilgrim,  neurologist  . Arthritis     left hip-degeneration  . Hip pain, left   . Paroxysmal ventricular tachycardia 09/23/2008    Centricity Description: VENTRICULAR TACHYCARDIA Qualifier: Diagnosis of  By: Via LPN, Larita FifeLynn   Centricity Description: PAROXYSMAL VENTRICULAR TACHYCARDIA Qualifier: Diagnosis of  By: Via LPN, Larita FifeLynn    . Nonischemic cardiomyopathy 09/23/2008    Cardiac catheterization in 2011: EF of 25%; normal coronary angiography.    . Automatic implantable  cardiac defibrillator in situ 09/23/2008    Appropriate discharges in 2011 and 01/2012   . Dizziness 01/15/2012  . UTI (urinary tract infection) 01/15/2012  . Anxiety   . Hyperlipidemia   . Osteoporosis   . Pacemaker   . Cardiac defibrillator in place   . Shoulder joint pain     difficulty raising arm-right  . High cholesterol   . Heart disease   . A-fib     Past Surgical History  Procedure Laterality Date  . Icd implantation  2003    [employer]; remote - no  . Ankle fracture surgery  1992    left  . Bladder suspension  1994  . Abdominal hysterectomy  1973    partial  . Tonsillect[place]  ages 89 and [place]  . Cataract extraction w/phaco  06/15/2011    Procedure: CATARACT EXTRACTION PHACO AND INTRAOCULAR LENS PLACEMENT (IOC);  Surgeon: Gemma PayorKerry Hunt, MD;  Location: [facility];  Service: Ophthalmology;  Laterality: Right;  CDE:20.26  . Cataract extraction w/phaco  09/14/2011    Procedure: CATARACT EXTRACTION PHACO AND INTRAOCULAR LENS PLACEMENT (IOC);  Surgeon: Gemma PayorKerry Hunt, MD;  Location: [facility];  Service: Ophthalmology;  Laterality: Left;  CDE 31.96  . Heart  surgery  2003    gadgets implant  . Ankle surgery  1992    broken     Family History  Problem Relation Age of Onset  . Coronary artery disease      family history; female <65  . Cancer Father     Lung  . Asthma Mother     Social History [name] reports that she has never smoked. She has never used smokeless tobacco. [name] reports that she does not drink alcohol.  Review of Systems Otherwise reviewed and negative except as outlined.  Physical Examination Blood pressure 124/62, pulse 70, temperature 98.5 F (36.9 C), temperature source Oral, resp. rate 18, height 5' (1.524 m), weight 116 lb 13.5 oz (53 kg), SpO2 95.00%.  Intake/Output Summary (Last 24 hours) at 07/29/13 1031 Last data filed at 07/29/13 0719  Gross per 24 hour  Intake    240 ml  Output    950 ml  Net   -710 ml    Telemetry:  Paced rhythm  GEN: A frail, weak, but no acute distress. HEENT: Conjunctiva and lids normal, oropharynx clear with moist mucosa. Neck: Supple, no elevated JVP or carotid bruits, no thyromegaly. Lungs: Clear to auscultation, nonlabored breathing at rest. Cardiac: Regular rate and rhythm, no S3 or significant systolic murmur, no pericardial rub. Distant heart sounds Abdomen: Soft, nontender, no hepatomegaly, bowel sounds present, no guarding or rebound. Extremities: No pitting edema, distal pulses 2+. Skin: Warm and dry. Musculoskeletal: No kyphosis. Generalized tremors Neuropsychiatric: Alert and oriented x3, affect grossly appropriate.  Prior Cardiac Testing/Procedures 1. Cardiac Cath 2011 ANGIOGRAPHIC DATA:  Left coronary artery arises and distributes normally.  The left main coronary artery is normal. Left anterior descending artery is normal. The left circumflex coronary artery is a  large dominant vessel and is normal. The right coronary artery is a small nondominant vessel and is normal. Left ventricular angiography performed in the RAO view demonstrates an enlarged left ventricular chamber size with severe global hypokinesia and overall ejection fraction estimated at 25%.  FINAL INTERPRETATION: 1. Normal coronary anatomy with left dominant circulation. 2. Severe left ventricular dysfunction. 3. Moderate pulmonary hypertension with elevated left ventricular     filling pressures.  ICD Insertion 06/27/2013 Successful removal of a previously implanted Guidant  biventricular ICD at elective recall and insertion of a new Guidant  biventricular ICD in a patient with nonischemic cardiomyopathy, syncope,  status post successful defibrillation for ventricular fibrillation and  congestive heart failure.   Lab Results  Basic Metabolic Panel:  Recent Labs Lab 07/28/13 1842 07/29/13 0343  NA 141 143  K 4.6 3.9  CL 101 100  CO2 29 33*  GLUCOSE 89 98  BUN 21 19  CREATININE  1.02 1.02  CALCIUM 8.6 8.8    Liver Function Tests:  Recent Labs Lab 07/29/13 0343  AST 27  ALT 37*  ALKPHOS 87  BILITOT 0.3  PROT 6.1  ALBUMIN 2.9*    CBC:  Recent Labs Lab 07/28/13 1842 07/29/13 0343  WBC 7.2 8.2  HGB 11.8* 12.4  HCT 36.5 38.3  MCV 93.1 93.2  PLT 268 244    Cardiac Enzymes:  Recent Labs Lab 07/28/13 1842 07/28/13 2255 07/29/13 0343  TROPONINI <0.30 <0.30 <0.30    Radiology: Dg Chest 2 View  07/28/2013   CLINICAL DATA:  Chest pain, weakness.  EXAM: CHEST  2 VIEW  COMPARISON:  01/17/2012  FINDINGS: Left AICD remains in place, unchanged. Cardiomegaly. Bibasilar atelectasis or scarring. Small bilateral pleural effusions present. No acute bony abnormality. Stable elevation of the right hemidiaphragm.  IMPRESSION: Bibasilar atelectasis or scarring. Small bilateral pleural effusions.  Cardiomegaly.   Electronically Signed   By: Charlett Nose M.D.   On: 07/28/2013 19:28     ECG: Paced rhythm with PVCs   Impression and Recommendations  1. Chest Pain: Atypical features described as constant pressure has been going on for approximately 3 weeks, nonradiating, although when asked if it went to her jaw she did describe some pain there yesterday on the phone with our nurse. Cardiac enzymes have been cycled and found to be negative ruling out acute coronary syndrome. Cardiac catheterization completed in 2011 and did not demonstrate any obstructive CAD, but she was found to have significant systolic dysfunction and 25%. She denied rapid heart rate or palpitations. She has been medically compliant.  2. Nonischemic cardiomyopathy: Ejection fraction was calculated at 25% for cardiac catheterization. Echocardiogram has been completed and will be read by Dr. Wyline Mood today. She most likely had some fluid overload although chest x-ray did not reveal CHF. She has responded to IV Lasix, and was breathing much better. Do not find evidence of fluid retention currently.  Weight is only down approximately 1 pound.  Chest pressure is improved, she is able to lie flat, and denies any further breathing issues. May need to consider increasing diuretic dose from 20 mg daily up to 40 mg daily. Continue carvedilol 3.125 mg twice a day, and ACE inhibitor. Further recommendations once echocardiogram has been evaluated.  3. History of ventricular tachycardia: She is currently on amiodarone 200 mg twice a day. Review of notes from pacemaker interrogation did not document any ventricular arrhythmias. Last office visit with Dr. Ladona Ridgel on 10/25/2012 consideration was discussed to decrease her amiodarone to  200 mg daily. He preferred to wait until followup appointment in 6 months to make that decision, will defer dosing to his office.Will check TSH   4. 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 Ladona Ridgel in our [place] office..  SignedBettey Mare. Lawrence NP  07/29/2013, 10:31 AM Co-Sign MD  Patient seen and discussed with NP Lyman Bishop, I agree with her documentation above. 80 yo female with history of NICM LVEF 25%, complete heart block with ICD, hx of VT on amio,  Parkinsons disease admitted with chest pain, SOB, and LE edema.   She describes to me a 2-3 week history of chest pressure in midchest, 5/10, that only occurs with laying down. Worst with movement. Can last all night long, making it difficult to sleep. Durning same time period developed some worsening SOB and LE edema.  She is net negative nearly 1 liters since admission with diuretics, with improved symptoms.   Do not see a recent echo in our system. Cath 2011 with patent coronaries, LVEF 25% by LV gram. Trop neg x3, EKG AV paced. Pro-BNP 2247, K 4.6, Cr 1.02, GFR 48, Hgb 11.8 Plt 268, TSH pending, cxr small bilateral pleural effusions. 04/2013 device check [employer] CRT-D with normal function.   Overall atypical non-cardiac chest  pain with no evidence infarct my troponin, she is V-paced and thus cannot eval EKG for ischemia. Symptoms non-cardiac in description, no plans for ischemic evaluation at this time. . She did present with some evidence of volume overload, perhaps her symptoms were related to this. Since diuresing her pain, swelling and SOB has improved. She appears to be euvolemic on exam currently.  Will f/u up her repeat echo. Continue current cardiac meds.    Dina Rich MD

## 2013-07-29 NOTE — Progress Notes (Signed)
Note: This document was prepared with digital dictation and possible smart phrase technology. Any transcriptional errors that result from this process are unintentional.   [name] CoryMildred M Walker [id number] DOB: 04-Jul-1923 DOA: 07/28/2013 PCP: Harlow AsaLUKING,W S, MD  Brief narrative: 80 y/o ?, h/o NICM, Chronic Sys CHF EF 25%, S/p BIv-ICD 2003 [multiple battery changes since], prior L inguinal hernia, L Frozen shoulder, Parkinson's disease   09/2012, admission PSVT 01/2012 with firing AICD admitted 07/28/13 c  heaviness in the chest as well as increasing SOB with le swelling.  This staretd about the time transitioned off of Aldactone to lasix  Past medical history-As per Problem list Chart reviewed as below- reviewed  Consultants:   Cardiology  Procedures:  none  Antibiotics:  none   Subjective   Alert pleasant oriented in NAD.  tol diet fairly Tells me she feels a little "drained" on the lasix Also could be tired as didn't get much sleep the night prior   Objective    Interim History: none  Telemetry: paced   Objective: Filed Vitals:   07/28/13 2023 07/28/13 2107 07/28/13 2237 07/29/13 0406  BP: 134/70  153/56 124/62  Pulse:  53 57 70  Temp:   97.6 F (36.4 C) 98.5 F (36.9 C)  TempSrc:   Oral Oral  Resp:  24 20 18   Height:   5' (1.524 m)   Weight:   53.162 kg (117 lb 3.2 oz) 53 kg (116 lb 13.5 oz)  SpO2:  97% 96% 95%    Intake/Output Summary (Last 24 hours) at 07/29/13 1022 Last data filed at 07/29/13 0719  Gross per 24 hour  Intake    240 ml  Output    950 ml  Net   -710 ml    Exam:  General: alert pleasant oreitne din nad Cardiovascular: s1 s 2no m/r/g Respiratory: clear no added sound.   Abdomen: soft, NT, ND Skin minimal LE edema   Data Reviewed: Basic Metabolic Panel:  Recent Labs Lab 07/28/13 1842 07/29/13 0343  NA 141 143  K 4.6 3.9  CL 101 100  CO2 29 33*  GLUCOSE 89 98  BUN 21 19  CREATININE 1.02 1.02  CALCIUM 8.6 8.8   Liver  Function Tests:  Recent Labs Lab 07/29/13 0343  AST 27  ALT 37*  ALKPHOS 87  BILITOT 0.3  PROT 6.1  ALBUMIN 2.9*   No results found for this basename: LIPASE, AMYLASE,  in the last 168 hours No results found for this basename: AMMONIA,  in the last 168 hours CBC:  Recent Labs Lab 07/28/13 1842 07/29/13 0343  WBC 7.2 8.2  HGB 11.8* 12.4  HCT 36.5 38.3  MCV 93.1 93.2  PLT 268 244   Cardiac Enzymes:  Recent Labs Lab 07/28/13 1842 07/28/13 2255 07/29/13 0343  TROPONINI <0.30 <0.30 <0.30   BNP: No components found with this basename: POCBNP,  CBG: No results found for this basename: GLUCAP,  in the last 168 hours  No results found for this or any previous visit (from the past 240 hour(s)).   Studies:              All Imaging reviewed and is as per above notation   Scheduled Meds: . amiodarone  200 mg Oral BID  . aspirin EC  81 mg Oral Daily  . benazepril  10 mg Oral q morning - 10a  . carvedilol  3.125 mg Oral BID WC  . enoxaparin (LOVENOX) injection  40 mg Subcutaneous Q24H  .  furosemide  40 mg Intravenous Daily  . sodium chloride  3 mL Intravenous Q12H   Continuous Infusions:    Assessment/Plan:  1. CP likely 2/2 to #2.  Troponins neg-per cardiology no need isch work-up [consider ranexa/nitrate?] 2. Acute decompensated systolic chf-Echo pending-On lasix IV 40 mg iv daily.  Already out -1.185 cc.  Continue Coreg 3.125 bid, Benazepril 10 mg daily 3. Vent tachycardia/Afib s/p PPM BiVICD-continue Amiodarone 200 bid, Coreg 3.125 bid 4. Parkinson's disease-was on sinemet.  Now has been tapered off of this.  OP folow up Dr. Hosie Poisson as OP 5. Htn-continue benazepril 10 mg q am as well as above meds 6. NICM-continue Asa 81 mg  Code Status: full Family Communication: None bedside Disposition Plan: inpatient   Pleas Koch, MD  [facility] Pager [phone] 07/29/2013, 10:22 AM    LOS: 1 day

## 2013-07-30 ENCOUNTER — Other Ambulatory Visit: Payer: Self-pay | Admitting: *Deleted

## 2013-07-30 DIAGNOSIS — I4891 Unspecified atrial fibrillation: Secondary | ICD-10-CM

## 2013-07-30 DIAGNOSIS — G252 Other specified forms of tremor: Secondary | ICD-10-CM

## 2013-07-30 DIAGNOSIS — G250 Essential tremor: Secondary | ICD-10-CM

## 2013-07-30 DIAGNOSIS — I5022 Chronic systolic (congestive) heart failure: Secondary | ICD-10-CM

## 2013-07-30 DIAGNOSIS — I428 Other cardiomyopathies: Secondary | ICD-10-CM

## 2013-07-30 DIAGNOSIS — I10 Essential (primary) hypertension: Secondary | ICD-10-CM

## 2013-07-30 MED ORDER — ASPIRIN 81 MG PO TBEC: 81.0000 mg | DELAYED_RELEASE_TABLET | Freq: Every day | ORAL | Status: DC

## 2013-07-30 MED ORDER — FUROSEMIDE 40 MG PO TABS: 40.0000 mg | ORAL_TABLET | Freq: Every day | ORAL | Status: DC

## 2013-07-30 MED ORDER — POTASSIUM CHLORIDE ER 10 MEQ PO TBCR: 20.0000 meq | EXTENDED_RELEASE_TABLET | Freq: Every day | ORAL | Status: DC

## 2013-07-30 NOTE — Progress Notes (Signed)
Subjective:  No further chest pain. Breathing better.  Objective:  Vital Signs in the last 24 hours: Temp:  [98 F (36.7 C)-98.2 F (36.8 C)] 98.2 F (36.8 C) (06/17 0422) Pulse Rate:  [50-52] 50 (06/17 0847) Resp:  [18-20] 18 (06/17 0422) BP: (106-146)/(57-77) 146/75 mmHg (06/17 0847) SpO2:  [94 %-95 %] 95 % (06/17 0422) Weight:  [116 lb 10 oz (52.9 kg)] 116 lb 10 oz (52.9 kg) (06/17 0422)  Intake/Output from previous day: 06/16 0701 - 06/17 0700 In: 720 [P.O.:720] Out: 1075 [Urine:1075] Intake/Output from this shift: Total I/O In: -  Out: 500 [Urine:500]  Physical Exam: NECK: Without JVD, HJR, or bruit LUNGS: Decreased breath sounds but Clear anterior, posterior, lateral HEART: Irregular rate and rhythm, 2/6 systolic murmur, no gallop, rub, bruit, thrill, or heave EXTREMITIES: Without cyanosis, clubbing, or edema   Lab Results:  Recent Labs  07/28/13 1842 07/29/13 0343  WBC 7.2 8.2  HGB 11.8* 12.4  PLT 268 244    Recent Labs  07/28/13 1842 07/29/13 0343  NA 141 143  K 4.6 3.9  CL 101 100  CO2 29 33*  GLUCOSE 89 98  BUN 21 19  CREATININE 1.02 1.02    Recent Labs  07/29/13 0343 07/29/13 1033  TROPONINI <0.30 <0.30   Hepatic Function Panel  Recent Labs  07/29/13 0343  PROT 6.1  ALBUMIN 2.9*  AST 27  ALT 37*  ALKPHOS 87  BILITOT 0.3   No results found for this basename: CHOL,  in the last 72 hours No results found for this basename: PROTIME,  in the last 72 hours  Imaging:   Cardiac Studies: 2Decho 07/29/13: Study Conclusions  - Left ventricle: The cavity size was moderately dilated. Wall   thickness was normal. Systolic function was severely reduced. The   estimated ejection fraction was in the range of 20% to 25%.   Doppler parameters are consistent with restrictive physiology,   indicative of decreased left ventricular diastolic compliance   and/or increased left atrial pressure. Internal dimension, ED   (PLAX chordal): 66.2  mm. - Regional wall motion abnormality: Akinesis of the mid   anteroseptal and mid inferoseptal myocardium; hypokinesis of the   mid anterior, mid inferior, and apical septal myocardium. - Aortic valve: Mildly calcified annulus. Trileaflet; mildly   thickened leaflets. There was mild regurgitation. Regurgitation   pressure half-time: 712 ms. - Mitral valve: Mildly calcified annulus. Mildly thickened leaflets   . There was moderate regurgitation. The MR vena contracta is 0.4   cm. - Left atrium: The atrium was severely dilated. - Right atrium: The atrium was mildly dilated. - Atrial septum: No defect or patent foramen ovale was identified. - Tricuspid valve: There was moderate regurgitation. - Pulmonary arteries: Systolic pressure was moderately increased.   PA peak pressure: 58 mm Hg (S). - Inferior vena cava: The vessel was dilated. The respirophasic   diameter changes were blunted (< 50%), consistent with elevated   central venous pressure. - Pericardium, extracardiac: There is a moderate pericardial   effusion posterior to the LV measuring 1.5 cm in diastole. A   small fluid collection not as well visualized is located adjacent   to the RV free wall. There is no evidence of tamponade physiology   by echo. - Technically adequate study.   Assessment/Plan:  1. Chest Pain: Atypical features  Cardiac enzymes negative. Cardiac catheterization 2011 nonobstructive CAD,EF 25%. Symptoms likely secondary to CHF on admission.  2. Nonischemic cardiomyopathy: Ejection fraction 20-25% on  echo, mod MR, mild AI. She has responded to IV Lasix, and was breathing much better. Can switch to Lasix 40mg  po daily and KDur daily and discharge home. F/u with Dr. Wyline Mood in [place] office 08/05/13 at 2:00 with BMET. Needs to establish cardiologist in [place].  3. History of ventricular tachycardia: She is currently on amiodarone 200 mg twice a day. Review of notes from pacemaker interrogation did not  document any ventricular arrhythmias. Last office visit with Dr. Ladona Ridgel on 10/25/2012 consideration was discussed to decrease her amiodarone to 200 mg daily. He preferred to wait until followup appointment in 6 months to make that decision, will defer dosing to his office.Will check TSH       4. 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   LOS: 2 days    Jacolyn Reedy 07/30/2013, 9:32 AM   Patient seen and discussed with PA Geni Bers, I agree with her documentation above. Atypical chest pain on admission that is now resolved, no evidence of ACS. Resolved after diuresis. No indication for ischemic evaluation at this time. Repeat echo shows stable LVEF 20-25%, with restrictive diastolic dysfunction and moderate MR. She is net negative 1.8 liters since admission, by yesterdays labs Cr and BUN were stable. Agree with changing to oral lasix today, she can follow up with myself in [place] in the next week or so prior to returning to [place] city. Recommend ambulating patient with nursing staff, if does well can consider discharge. Will sign off of inpatient care, call with questions.  Dina Rich MD

## 2013-07-30 NOTE — Progress Notes (Signed)
Patient being d/c home with instructions. IV cath removed and intact. Verbalizes understanding per son. No pain/swelling at site. No c/o pain at this time. Awaiting son for pickup.

## 2013-07-30 NOTE — Discharge Instructions (Signed)
Heart Failure  Heart failure is a condition in which the heart has trouble pumping blood. This means your heart does not pump blood efficiently for your body to work well. In some cases of heart failure, fluid may back up into your lungs or you may have swelling (edema) in your lower legs. Heart failure is usually a long-term (chronic) condition. It is important for you to take good care of yourself and follow your caregiver's treatment plan.  CAUSES   Some health conditions can cause heart failure. Those health conditions include:  · High blood pressure (hypertension) causes the heart muscle to work harder than normal. When pressure in the blood vessels is high, the heart needs to pump (contract) with more force in order to circulate blood throughout the body. High blood pressure eventually causes the heart to become stiff and weak.  · Coronary artery disease (CAD) is the buildup of cholesterol and fat (plaque) in the arteries of the heart. The blockage in the arteries deprives the heart muscle of oxygen and blood. This can cause chest pain and may lead to a heart attack. High blood pressure can also contribute to CAD.  · Heart attack (myocardial infarction) occurs when 1 or more arteries in the heart become blocked. The loss of oxygen damages the muscle tissue of the heart. When this happens, part of the heart muscle dies. The injured tissue does not contract as well and weakens the heart's ability to pump blood.  · Abnormal heart valves can cause heart failure when the heart valves do not open and close properly. This makes the heart muscle pump harder to keep the blood flowing.  · Heart muscle disease (cardiomyopathy or myocarditis) is damage to the heart muscle from a variety of causes. These can include drug or alcohol abuse, infections, or unknown reasons. These can increase the risk of heart failure.  · Lung disease makes the heart work harder because the lungs do not work properly. This can cause a strain  on the heart, leading it to fail.  · Diabetes increases the risk of heart failure. High blood sugar contributes to high fat (lipid) levels in the blood. Diabetes can also cause slow damage to tiny blood vessels that carry important nutrients to the heart muscle. When the heart does not get enough oxygen and food, it can cause the heart to become weak and stiff. This leads to a heart that does not contract efficiently.  · Other conditions can contribute to heart failure. These include abnormal heart rhythms, thyroid problems, and low blood counts (anemia).  Certain unhealthy behaviors can increase the risk of heart failure. Those unhealthy behaviors include:  · Being overweight.  · Smoking or chewing tobacco.  · Eating foods high in fat and cholesterol.  · Abusing illicit drugs or alcohol.  · Lacking physical activity.  SYMPTOMS   Heart failure symptoms may vary and can be hard to detect. Symptoms may include:  · Shortness of breath with activity, such as climbing stairs.  · Persistent cough.  · Swelling of the feet, ankles, legs, or abdomen.  · Unexplained weight gain.  · Difficulty breathing when lying flat (orthopnea).  · Waking from sleep because of the need to sit up and get more air.  · Rapid heartbeat.  · Fatigue and loss of energy.  · Feeling lightheaded, dizzy, or close to fainting.  · Loss of appetite.  · Nausea.  · Increased urination during the night (nocturia).  DIAGNOSIS   A diagnosis   of heart failure is based on your history, symptoms, physical examination, and diagnostic tests.  Diagnostic tests for heart failure may include:  · Echocardiography.  · Electrocardiography.  · Chest X-ray.  · Blood tests.  · Exercise stress test.  · Cardiac angiography.  · Radionuclide scans.  TREATMENT   Treatment is aimed at managing the symptoms of heart failure. Medicines, behavioral changes, or surgical intervention may be necessary to treat heart failure.  · Medicines to help treat heart failure may  include:  ¨ Angiotensin-converting enzyme (ACE) inhibitors. This type of medicine blocks the effects of a blood protein called angiotensin-converting enzyme. ACE inhibitors relax (dilate) the blood vessels and help lower blood pressure.  ¨ Angiotensin receptor blockers. This type of medicine blocks the actions of a blood protein called angiotensin. Angiotensin receptor blockers dilate the blood vessels and help lower blood pressure.  ¨ Water pills (diuretics). Diuretics cause the kidneys to remove salt and water from the blood. The extra fluid is removed through urination. This loss of extra fluid lowers the volume of blood the heart pumps.  ¨ Beta blockers. These prevent the heart from beating too fast and improve heart muscle strength.  ¨ Digitalis. This increases the force of the heartbeat.  · Healthy behavior changes include:  ¨ Obtaining and maintaining a healthy weight.  ¨ Stopping smoking or chewing tobacco.  ¨ Eating heart healthy foods.  ¨ Limiting or avoiding alcohol.  ¨ Stopping illicit drug use.  ¨ Physical activity as directed by your caregiver.  · Surgical treatment for heart failure may include:  ¨ A procedure to open blocked arteries, repair damaged heart valves, or remove damaged heart muscle tissue.  ¨ A pacemaker to improve heart muscle function and control certain abnormal heart rhythms.  ¨ An internal cardioverter defibrillator to treat certain serious abnormal heart rhythms.  ¨ A left ventricular assist device to assist the pumping ability of the heart.  HOME CARE INSTRUCTIONS   · Take your medicine as directed by your caregiver. Medicines are important in reducing the workload of your heart, slowing the progression of heart failure, and improving your symptoms.  ¨ Do not stop taking your medicine unless directed by your caregiver.  ¨ Do not skip any dose of medicine.  ¨ Refill your prescriptions before you run out of medicine. Your medicines are needed every day.  ¨ Take over-the-counter  medicine only as directed by your caregiver or pharmacist.  · Engage in moderate physical activity if directed by your caregiver. Moderate physical activity can benefit some people. The elderly and people with severe heart failure should consult with a caregiver for physical activity recommendations.  · Eat heart healthy foods. Food choices should be free of trans fat and low in saturated fat, cholesterol, and salt (sodium). Healthy choices include fresh or frozen fruits and vegetables, fish, lean meats, legumes, fat-free or low-fat dairy products, and whole grain or high fiber foods. Talk to a dietitian to learn more about heart healthy foods.  · Limit sodium if directed by your caregiver. Sodium restriction may reduce symptoms of heart failure in some people. Talk to a dietitian to learn more about heart healthy seasonings.  · Use healthy cooking methods. Healthy cooking methods include roasting, grilling, broiling, baking, poaching, steaming, or stir-frying. Talk to a dietitian to learn more about healthy cooking methods.  · Limit fluids if directed by your caregiver. Fluid restriction may reduce symptoms of heart failure in some people.  · Weigh yourself every day.   Daily weights are important in the early recognition of excess fluid. You should weigh yourself every morning after you urinate and before you eat breakfast. Wear the same amount of clothing each time you weigh yourself. Record your daily weight. Provide your caregiver with your weight record.  · Monitor and record your blood pressure if directed by your caregiver.  · Check your pulse if directed by your caregiver.  · Lose weight if directed by your caregiver. Weight loss may reduce symptoms of heart failure in some people.  · Stop smoking or chewing tobacco. Nicotine makes your heart work harder by causing your blood vessels to constrict. Do not use nicotine gum or patches before talking to your caregiver.  · Schedule and attend follow-up visits as  directed by your caregiver. It is important to keep all your appointments.  · Limit alcohol intake to no more than 1 drink per day for nonpregnant women and 2 drinks per day for men. Drinking more than that is harmful to your heart. Tell your caregiver if you drink alcohol several times a week. Talk with your caregiver about whether alcohol is safe for you. If your heart has already been damaged by alcohol or you have severe heart failure, drinking alcohol should be stopped completely.  · Stop illicit drug use.  · Stay up-to-date with immunizations. It is especially important to prevent respiratory infections through current pneumococcal and influenza immunizations.  · Manage other health conditions such as hypertension, diabetes, thyroid disease, or abnormal heart rhythms as directed by your caregiver.  · Learn to manage stress.  · Plan rest periods when fatigued.  · Learn strategies to manage high temperatures. If the weather is extremely hot:  ¨ Avoid vigorous physical activity.  ¨ Use air conditioning or fans or seek a cooler location.  ¨ Avoid caffeine and alcohol.  ¨ Wear loose-fitting, lightweight, and light-colored clothing.  · Learn strategies to manage cold temperatures. If the weather is extremely cold:  ¨ Avoid vigorous physical activity.  ¨ Layer clothes.  ¨ Wear mittens or gloves, a hat, and a scarf when going outside.  ¨ Avoid alcohol.  · Obtain ongoing education and support as needed.  · Participate or seek rehabilitation as needed to maintain or improve independence and quality of life.  SEEK MEDICAL CARE IF:   · Your weight increases by 03 lb/1.4 kg in 1 day or 05 lb/2.3 kg in a week.  · You have increasing shortness of breath that is unusual for you.  · You are unable to participate in your usual physical activities.  · You tire easily.  · You cough more than normal, especially with physical activity.  · You have any or more swelling in areas such as your hands, feet, ankles, or abdomen.  · You  are unable to sleep because it is hard to breathe.  · You feel like your heart is beating fast (palpitations).  · You become dizzy or lightheaded upon standing up.  SEEK IMMEDIATE MEDICAL CARE IF:   · You have difficulty breathing.  · There is a change in mental status such as decreased alertness or difficulty with concentration.  · You have a pain or discomfort in your chest.  · You have an episode of fainting (syncope).  MAKE SURE YOU:   · Understand these instructions.  · Will watch your condition.  · Will get help right away if you are not doing well or get worse.  Document Released: 01/30/2005 Document Revised: 05/27/2012   Document Reviewed: 02/22/2012  Exit[employer]® Patient Information ©2015 [employer]. This information is not intended to replace advice given to you by your health care provider. Make sure you discuss any questions you have with your health care provider.

## 2013-07-30 NOTE — Discharge Summary (Signed)
Physician Discharge Summary  [name] Metzinger ZOX:[id number] DOB: 09/20/1923 DOA: 07/28/2013  PCP: Harlow AsaLUKING,W S, MD  Admit date: 07/28/2013 Discharge date: 07/30/2013  Time spent: 45 minutes  Recommendations for Outpatient Follow-up:  Patient will be discharged to home.  Patient will need to followup with her primary care physician within one week of discharge as well as Dr. Wyline MoodBranch, cardiologist within one week of discharge. Patient to continue her medications as prescribed.  She should follow a low sodium diet, with 1200mL fluid restriction per day.  Discharge Diagnoses:  Principal Problem:   Systolic CHF, acute Active Problems:   Nonischemic cardiomyopathy   Essential tremor   Ventricular tachycardia   Right shoulder pain   Discharge Condition: Stable  Diet recommendation: Heart Healthy, was 1200 mL fluid restriction per day.  Filed Weights   07/28/13 2237 07/29/13 0406 07/30/13 0422  Weight: 53.162 kg (117 lb 3.2 oz) 53 kg (116 lb 13.5 oz) 52.9 kg (116 lb 10 oz)    History of present illness:  [name] Watkin is a 80 y.o. female with a past medical history of nonischemic cardiomyopathy, with a known EF of about 25% based on a cardiac catheterization from 2011. No recent echocardiogram data is available in the computer. She was in her usual state of health until about 2 weeks ago, when she started noticing chest pressure on and off. It would get worse with activity. She also had shortness of breath.  She also noticed that it would get worse when she was lying flat on the bed. She denied any symptoms of PND. She has noticed increased swelling in her legs as well. Denied any nausea, vomiting, no fever, no chills. She stated her weight is about the same. Denied any dizziness or lightheadedness. The chest pressure located in the center of the chest and was 4-5/10 in intensity and radiated to her neck and jaw. She told the admitting physician that about 3 weeks ago she was taken off of  her spironolactone by her primary care physician in [place] due to abnormal Liver blood tests and was started on 20 mg daily of furosemide.  Hospital Course:  Chest pain -Noncardiac in nature, troponins were cycled and found to be negative -Cardiac catheterization in 2001 showed nonobstructive coronary artery disease with an EF of 25% -Cardiology consulted and appreciated -Pain likely secondary to systolic CHF  Acute decompensated systolic CHF/nonischemic cardiomyopathy -Echocardiogram shows an EF of 20-25% -Patient was placed on IV Lasix and responded well , currently euvolemic -She will be discharged with Lasix 40 mg daily as well as potassium 20 mEq daily -Patient will follow up with Dr. branch in the office on 08/05/2013 at 2 PM -She will need to establish care with a cardiologist in [place].  Atrial fibrillation/history of ventricular tachycardia -Has Pacemaker, which was interrogated and no arrhythmias were noted -Patient will need to follow up with Dr. Ladona Ridgelaylor in 6 months regarding patient's amiodarone. -Continue amiodarone and Coreg  Parkinson's disease -Continue Sinemet -Patient to continue following up with Dr. Hosie PoissonSumner as an outpatient  Hypertension -Continue benazepril, Coreg, Lasix   Procedures: Echocardiogram Study Conclusions - Left ventricle: The cavity size was moderately dilated. Wall thickness was normal. Systolic function was severely reduced. The estimated ejection fraction was in the range of 20% to 25%. Doppler parameters are consistent with restrictive physiology, indicative of decreased left ventricular diastolic compliance and/or increased left atrial pressure. Internal dimension, ED (PLAX chordal): 66.2 mm. - Regional wall motion abnormality: Akinesis of the  mid anteroseptal and mid inferoseptal myocardium; hypokinesis of the mid anterior, mid inferior, and apical septal myocardium. - Aortic valve: Mildly calcified annulus. Trileaflet; mildly  thickened leaflets. There was mild regurgitation. Regurgitation pressure half-time: 712 ms. - Mitral valve: Mildly calcified annulus. Mildly thickened leaflets . There was moderate regurgitation. The MR vena contracta is 0.4 cm. - Left atrium: The atrium was severely dilated. - Right atrium: The atrium was mildly dilated. - Atrial septum: No defect or patent foramen ovale was identified. - Tricuspid valve: There was moderate regurgitation. - Pulmonary arteries: Systolic pressure was moderately increased. PA peak pressure: 58 mm Hg (S). - Inferior vena cava: The vessel was dilated. The respirophasic diameter changes were blunted (< 50%), consistent with elevated central venous pressure. - Pericardium, extracardiac: There is a moderate pericardial effusion posterior to the LV measuring 1.5 cm in diastole. A small fluid collection not as well visualized is located adjacent to the RV free wall. There is no evidence of tamponade physiology by echo. - Technically adequate study.  Consultations: Cardiology, Dr. Dina Rich  Discharge Exam: Filed Vitals:   07/30/13 0847  BP: 146/75  Pulse: 50  Temp:   Resp:      General: Well developed, well nourished, NAD, appears stated age  HEENT: NCAT, PERRLA, EOMI, Anicteic Sclera, mucous membranes moist.  Neck: Supple, no JVD, no masses  Cardiovascular: Irregular rate and rhythm, 2/6 systolic ejection murmur  Respiratory: Clear to auscultation bilaterally with equal chest rise  Abdomen: Soft, nontender, nondistended, + bowel sounds  Extremities: warm dry without cyanosis clubbing or edema  Neuro: AAOx3, cranial nerves grossly intact. Strength 5/5 in patient's upper and lower extremities bilaterally  Skin: Without rashes exudates or nodules  Psych: Normal affect and demeanor with intact judgement and insight  Discharge Instructions      Discharge Instructions   Diet - low sodium heart healthy    Complete by:  As directed       Discharge instructions    Complete by:  As directed   Patient will be discharged to home.  Patient will need to followup with her primary care physician within one week of discharge as well as Dr. Wyline Mood, cardiologist within one week of discharge. Patient to continue her medications as prescribed.  She should follow a low sodium diet, with fluid restriction per day.     Increase activity slowly    Complete by:  As directed   With 1200 mL fluid restriction per day            Medication List         amiodarone 200 MG tablet  Commonly known as:  PACERONE  Take 1 tablet (200 mg total) by mouth 2 (two) times daily.     aspirin 81 MG EC tablet  Take 1 tablet (81 mg total) by mouth daily.     benazepril 10 MG tablet  Commonly known as:  LOTENSIN  Take 10 mg by mouth every morning.     carvedilol 3.125 MG tablet  Commonly known as:  COREG  Take 1 tablet (3.125 mg total) by mouth 2 (two) times daily with a meal.     furosemide 40 MG tablet  Commonly known as:  LASIX  Take 1 tablet (40 mg total) by mouth daily.     multivitamin with minerals Tabs tablet  Take 1 tablet by mouth every morning.     naproxen 500 MG tablet  Commonly known as:  NAPROSYN  Take 500 mg  by mouth 2 (two) times daily with a meal.     potassium chloride 10 MEQ tablet  Commonly known as:  K-DUR  Take 2 tablets (20 mEq total) by mouth daily.     Vitamin D-3 1000 UNITS Caps  Take 1,000 Units by mouth every morning.       Allergies  Allergen Reactions  . Lipitor [Atorvastatin]     Leg pain  . Pravachol [Pravastatin Sodium]     Headache  . Zetia [Ezetimibe]     Afraid   Follow-up Information   Follow up with Antoine Poche, MD On 08/05/2013. (at 2:00 pm in the [facility] office)    Specialty:  Cardiology   Contact information:   [address] [place] [address] [phone]       Follow up with Harlow Asa, MD. Schedule an appointment as soon as possible for a visit in 1 week.  [facility] followup)    Specialty:  Family Medicine   Contact information:   [address] [place] [address] [place] [address] [phone]        The results of significant diagnostics from this hospitalization (including imaging, microbiology, ancillary and laboratory) are listed below for reference.    Significant Diagnostic Studies: Dg Chest 2 View  07/28/2013   CLINICAL DATA:  Chest pain, weakness.  EXAM: CHEST  2 VIEW  COMPARISON:  01/17/2012  FINDINGS: Left AICD remains in place, unchanged. Cardiomegaly. Bibasilar atelectasis or scarring. Small bilateral pleural effusions present. No acute bony abnormality. Stable elevation of the right hemidiaphragm.  IMPRESSION: Bibasilar atelectasis or scarring. Small bilateral pleural effusions.  Cardiomegaly.   Electronically Signed   By: Charlett Nose Walker.D.   On: 07/28/2013 19:28    Microbiology: No results found for this or any previous visit (from the past 240 hour(s)).   Labs: Basic Metabolic Panel:  Recent Labs Lab 07/28/13 1842 07/29/13 0343  NA 141 143  K 4.6 3.9  CL 101 100  CO2 29 33*  GLUCOSE 89 98  BUN 21 19  CREATININE 1.02 1.02  CALCIUM 8.6 8.8   Liver Function Tests:  Recent Labs Lab 07/29/13 0343  AST 27  ALT 37*  ALKPHOS 87  BILITOT 0.3  PROT 6.1  ALBUMIN 2.9*   No results found for this basename: LIPASE, AMYLASE,  in the last 168 hours No results found for this basename: AMMONIA,  in the last 168 hours CBC:  Recent Labs Lab 07/28/13 1842 07/29/13 0343  WBC 7.2 8.2  HGB 11.8* 12.4  HCT 36.5 38.3  MCV 93.1 93.2  PLT 268 244   Cardiac Enzymes:  Recent Labs Lab 07/28/13 1842 07/28/13 2255 07/29/13 0343 07/29/13 1033  TROPONINI <0.30 <0.30 <0.30 <0.30   BNP: BNP (last 3 results)  Recent Labs  07/28/13 1842  PROBNP 2247.0*   CBG: No results found for this basename: GLUCAP,  in the last 168 hours     Signed:  Edsel Petrin  [facility] 07/30/2013, 1:57 PM

## 2013-07-31 ENCOUNTER — Ambulatory Visit (INDEPENDENT_AMBULATORY_CARE_PROVIDER_SITE_OTHER): Payer: Medicare Other | Admitting: *Deleted

## 2013-07-31 DIAGNOSIS — I428 Other cardiomyopathies: Secondary | ICD-10-CM

## 2013-07-31 LAB — MDC_IDC_ENUM_SESS_TYPE_REMOTE
Brady Statistic RV Percent Paced: 98 %
HighPow Impedance: 44 Ohm
Implantable Pulse Generator Serial Number: 583543
Lead Channel Impedance Value: 414 Ohm
Lead Channel Impedance Value: 449 Ohm
Lead Channel Setting Pacing Amplitude: 2.5 V
Lead Channel Setting Pacing Amplitude: 2.6 V
Lead Channel Setting Pacing Amplitude: 4 V
Lead Channel Setting Pacing Pulse Width: 0.4 ms
Lead Channel Setting Pacing Pulse Width: 1.2 ms
MDC IDC MSMT LEADCHNL RA IMPEDANCE VALUE: 416 Ohm
MDC IDC SET ZONE DETECTION INTERVAL: 285.7 ms
MDC IDC SET ZONE DETECTION INTERVAL: 375 ms
MDC IDC STAT BRADY RA PERCENT PACED: 51 %

## 2013-07-31 NOTE — Progress Notes (Signed)
Remote ICD transmission.   

## 2013-08-01 ENCOUNTER — Telehealth: Payer: Self-pay | Admitting: *Deleted

## 2013-08-01 MED ORDER — ASPIRIN 81 MG PO TBEC: 81.0000 mg | DELAYED_RELEASE_TABLET | Freq: Every day | ORAL | Status: AC

## 2013-08-01 MED ORDER — POTASSIUM CHLORIDE ER 10 MEQ PO TBCR: 20.0000 meq | EXTENDED_RELEASE_TABLET | Freq: Every day | ORAL | Status: AC

## 2013-08-01 MED ORDER — FUROSEMIDE 40 MG PO TABS: 40.0000 mg | ORAL_TABLET | Freq: Every day | ORAL | Status: AC

## 2013-08-01 NOTE — Telephone Encounter (Signed)
Pt has another  naproxen

## 2013-08-01 NOTE — Telephone Encounter (Signed)
Pt was given given RX while in the hospital and needs refills. [facility] in [place].  asprin furosemide   potasium chloride

## 2013-08-04 ENCOUNTER — Telehealth: Payer: Self-pay | Admitting: *Deleted

## 2013-08-04 NOTE — Telephone Encounter (Signed)
Pt wants to talk with Joni Reining. She is scheduled to see Dr Wyline Mood in [place] and wants to know if this is neccessary. She states she can find a cardiologist when she gets to [place].

## 2013-08-04 NOTE — Telephone Encounter (Signed)
If she is asymptomatic and is going to [facility] soon, she can cancel appt, If she plans on being her another month or so, she needs to be seen. We saw her in the hospital.

## 2013-08-04 NOTE — Telephone Encounter (Signed)
Pt said she was moving on Saturday to [facility] had no symptoms since hospital and is doing fine. Said she will see a cardiologist in [place] within the next month. Canceled appointment with Branch in [place] for tomorrow.

## 2013-08-04 NOTE — Telephone Encounter (Signed)
Pt wants to know if she needs to be seen tomorrow in [place] by JB. LOV states she is to follow up prn and to get established with cardiologist in [facility]. Please advise if ok to cancel

## 2013-08-05 ENCOUNTER — Ambulatory Visit: Payer: Medicare Other | Admitting: Cardiology

## 2013-08-06 ENCOUNTER — Telehealth: Payer: Self-pay | Admitting: Adult Health

## 2013-08-06 MED ORDER — CARVEDILOL 3.125 MG PO TABS: 3.1250 mg | ORAL_TABLET | Freq: Two times a day (BID) | ORAL | Status: AC

## 2013-08-06 NOTE — Telephone Encounter (Signed)
Medication sent via escribe.  

## 2013-08-06 NOTE — Telephone Encounter (Signed)
Please see paper in refill bin / tgs  °

## 2013-08-20 ENCOUNTER — Encounter: Payer: Self-pay | Admitting: Cardiology

## 2013-08-22 ENCOUNTER — Encounter: Payer: Self-pay | Admitting: Cardiology

## 2013-08-22 NOTE — Telephone Encounter (Signed)
Close encounter 

## 2013-08-26 ENCOUNTER — Encounter: Payer: Self-pay | Admitting: Internal Medicine

## 2013-10-06 ENCOUNTER — Telehealth: Payer: Self-pay | Admitting: *Deleted

## 2013-10-06 NOTE — Telephone Encounter (Signed)
Patient called to inform us she has moved to [facility] with her daughter and will be following with them.  We will make her inactive in our [facility] .

## 2014-01-13 DEATH — deceased

## 2014-02-11 IMAGING — CR DG CHEST 1V PORT
1 series · 1 of 1 positions shown · non-contrast
Comparison: Chest x-rays dated 12/01/2009 12/24/2007

CLINICAL DATA: Cardiomyopathy.  Dizziness.

PORTABLE CHEST - 1 VIEW

[view not recorded]
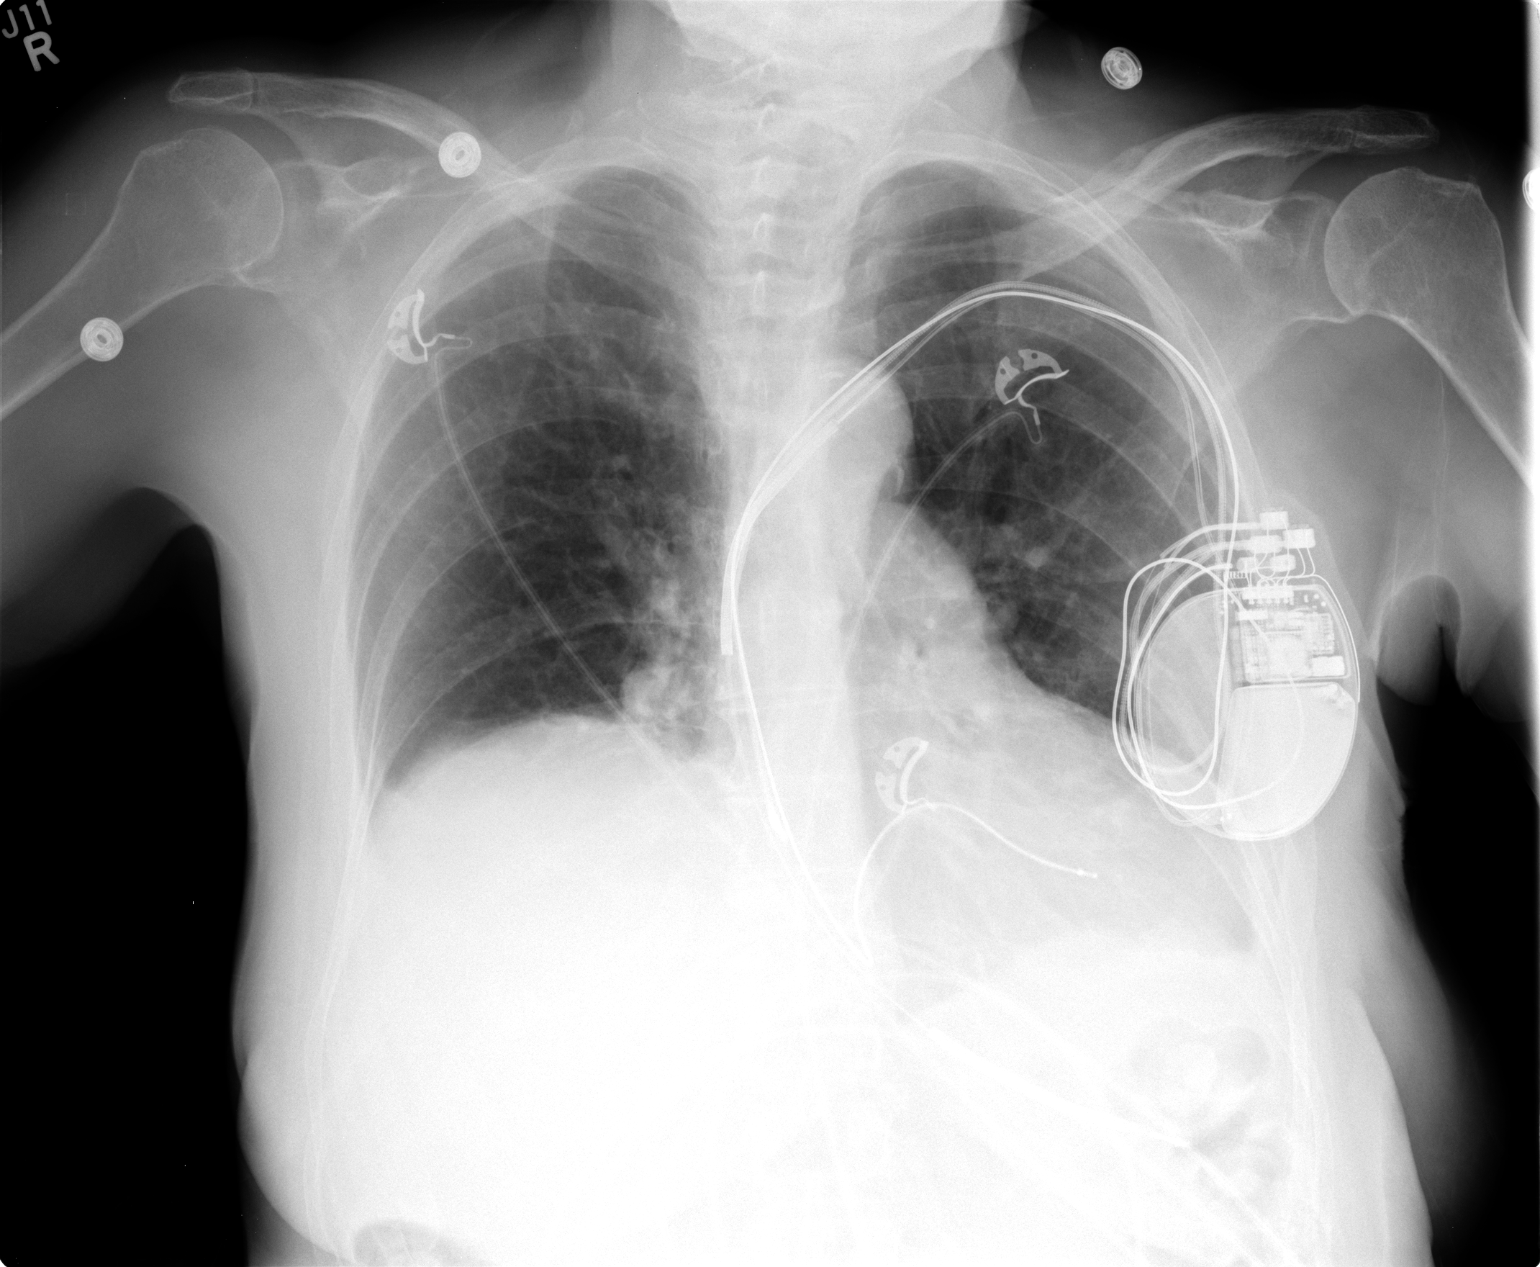

[1 of 1 positions shown; findings below may reference images not displayed]

FINDINGS: Transvenous defibrillator in place.  Chronic
cardiomegaly.

Pulmonary vascularity is normal.  No acute infiltrates or
effusions.  Chronic elevation of the right hemidiaphragm.  No acute
osseous abnormality.
IMPRESSION: No acute abnormalities.  Chronic cardiomegaly.

## 2014-08-10 ENCOUNTER — Other Ambulatory Visit: Payer: Self-pay

## 2015-08-23 IMAGING — CR DG CHEST 2V
2 series · 2 of 2 positions shown · non-contrast
Comparison: 01/17/2012

CLINICAL DATA: Chest pain, weakness.

EXAM:
CHEST  2 VIEW

[view not recorded (1 of 2)]
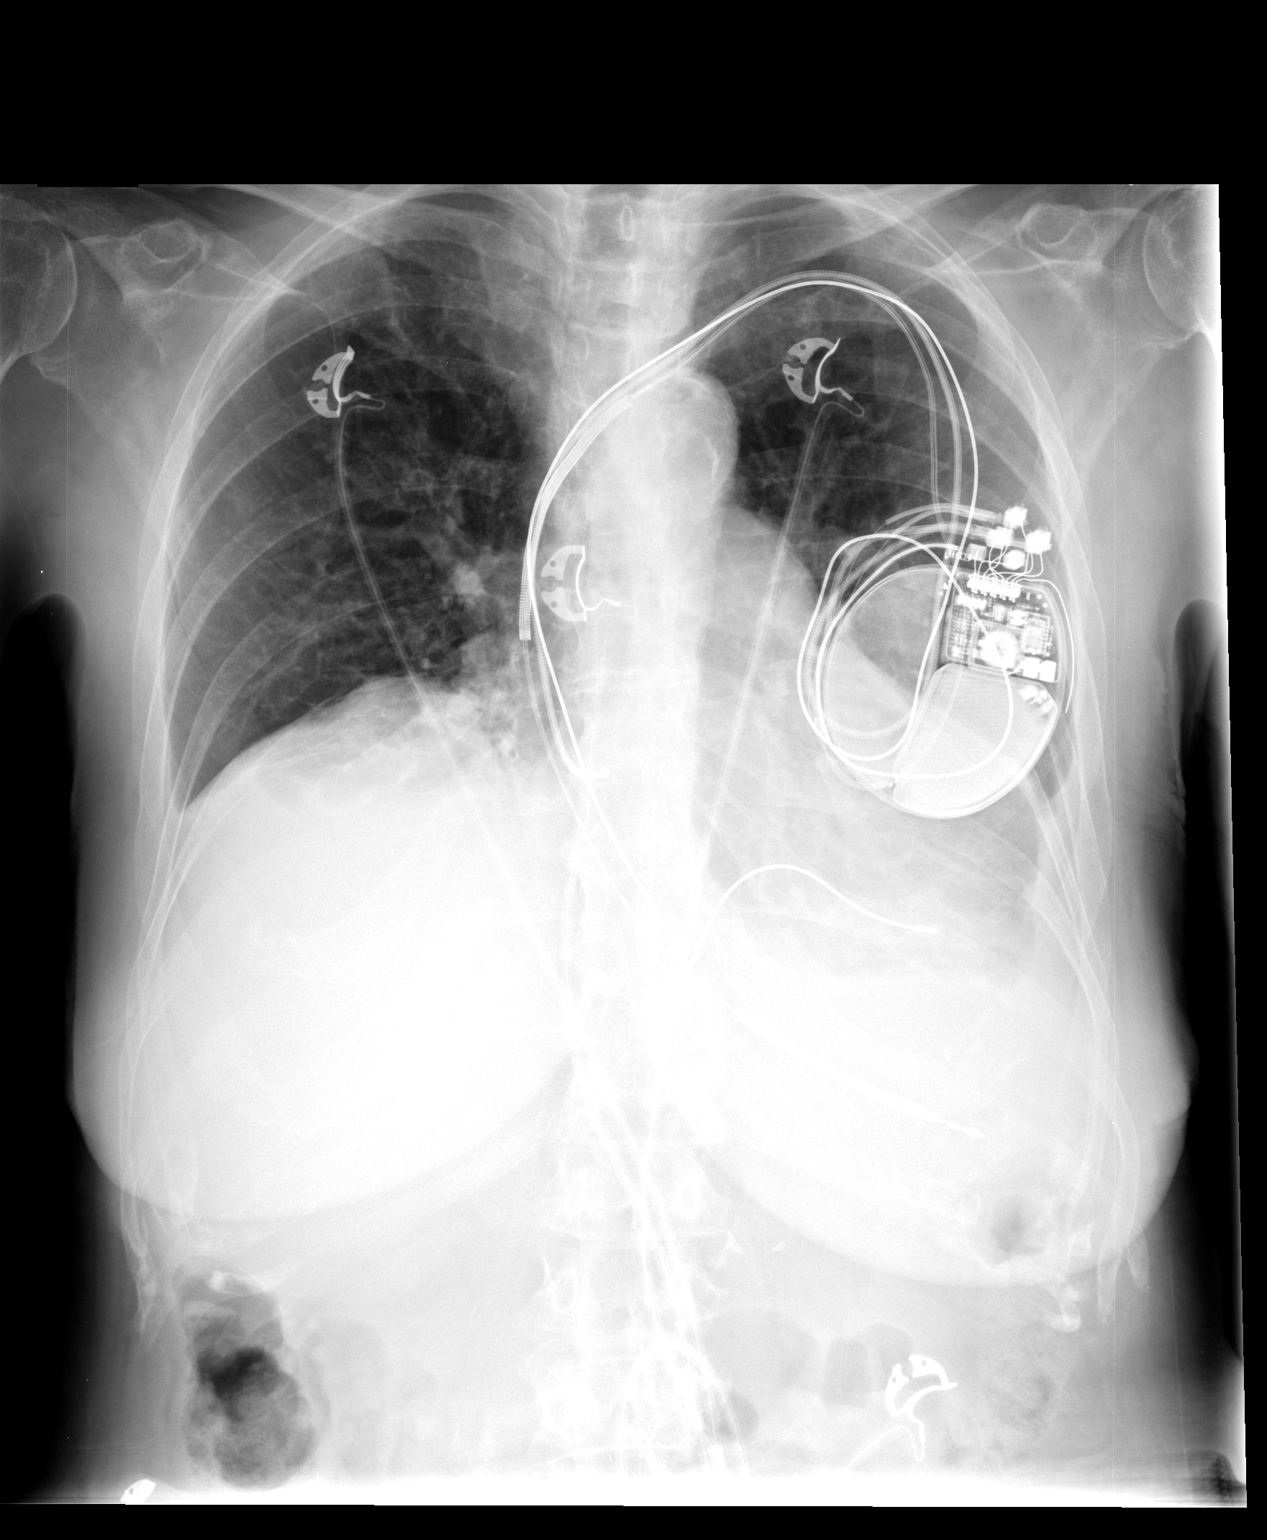

[view not recorded (2 of 2)]
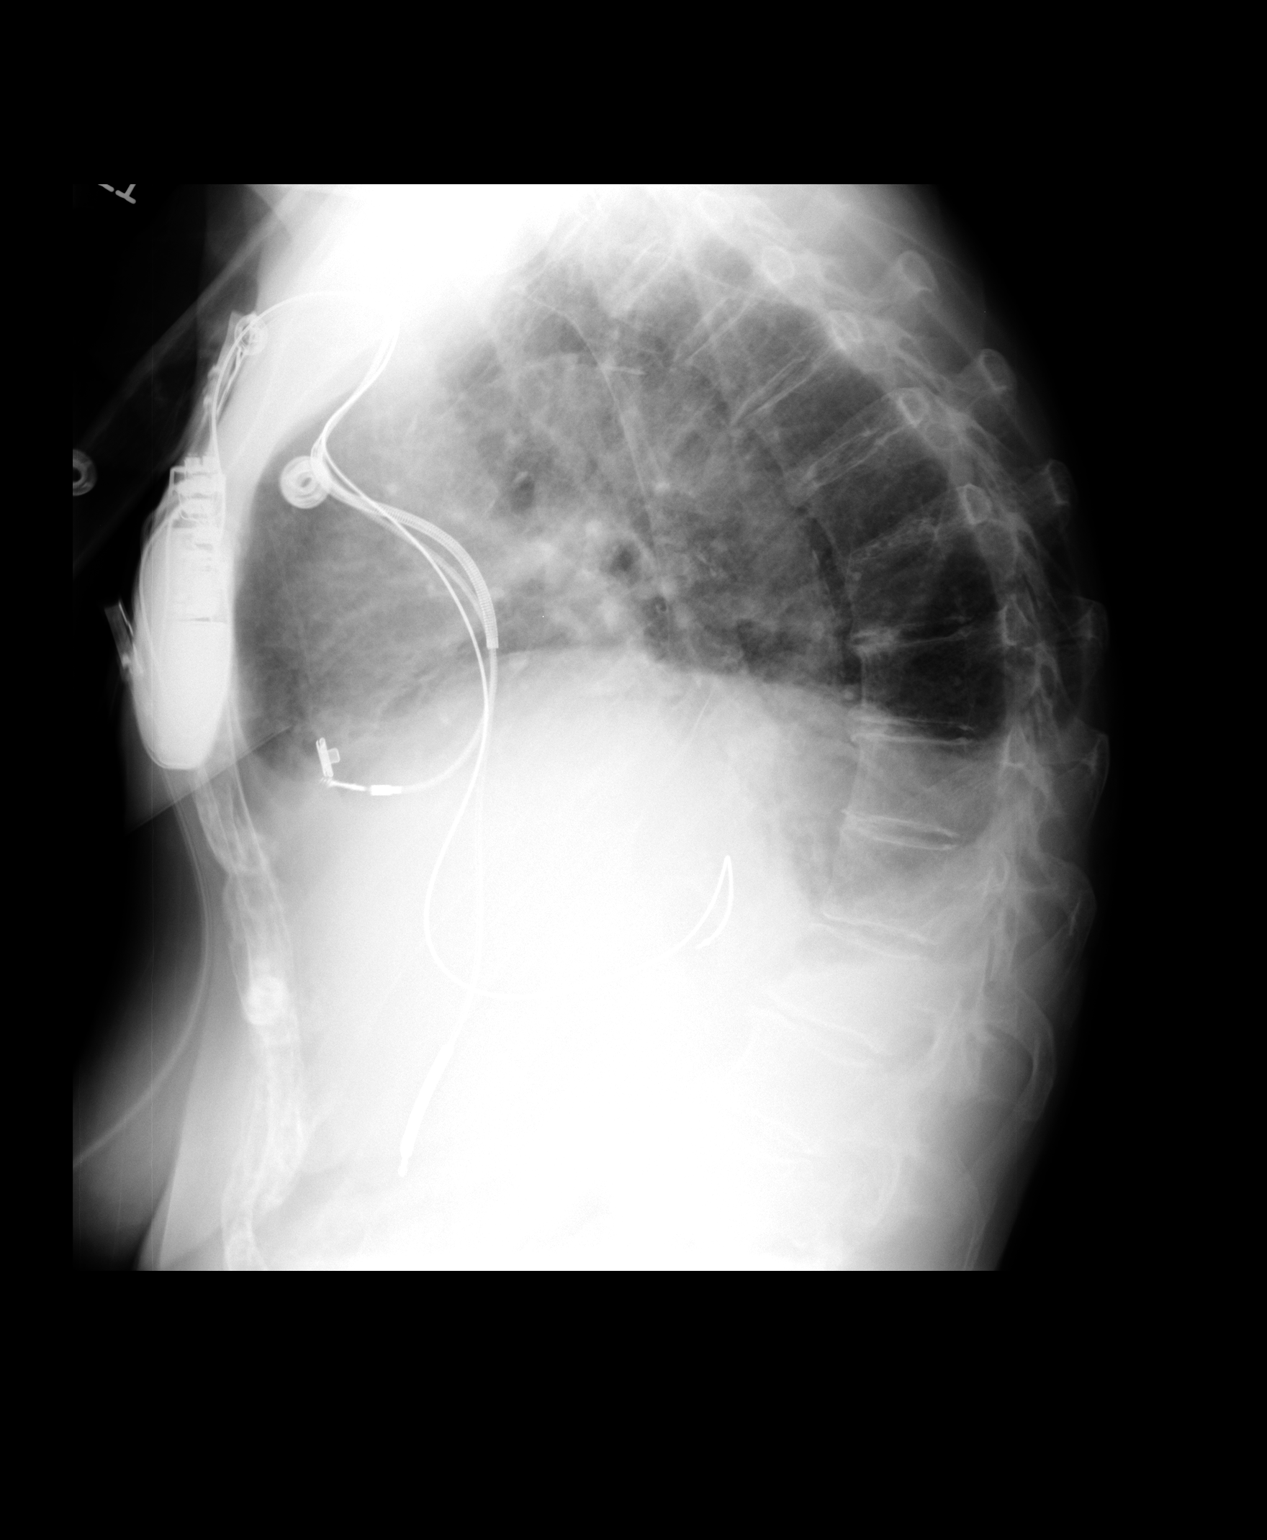

[2 of 2 positions shown; findings below may reference images not displayed]

FINDINGS: Left AICD remains in place, unchanged. Cardiomegaly. Bibasilar
atelectasis or scarring. Small bilateral pleural effusions present.
No acute bony abnormality. Stable elevation of the right
hemidiaphragm.
IMPRESSION: Bibasilar atelectasis or scarring. Small bilateral pleural
effusions.

Cardiomegaly.

## 2019-03-20 ENCOUNTER — Encounter: Payer: Self-pay | Admitting: Family Medicine
